# Patient Record
Sex: Male | Born: 1965 | Race: Black or African American | Hispanic: No | Marital: Single | Smoking: Current some day smoker
Health system: Southern US, Community
[De-identification: ages and names within clinical notes are randomized; demographics above are authoritative.]

## PROBLEM LIST (undated history)

## (undated) DIAGNOSIS — E119 Type 2 diabetes mellitus without complications: Secondary | ICD-10-CM

## (undated) DIAGNOSIS — S069XAA Unspecified intracranial injury with loss of consciousness status unknown, initial encounter: Secondary | ICD-10-CM

## (undated) HISTORY — PX: APPENDECTOMY: SHX54

---

## 1998-01-08 ENCOUNTER — Emergency Department (HOSPITAL_COMMUNITY): Admission: EM | Admit: 1998-01-08 | Discharge: 1998-01-08 | Payer: Self-pay | Admitting: Emergency Medicine

## 2000-08-02 ENCOUNTER — Emergency Department (HOSPITAL_COMMUNITY): Admission: EM | Admit: 2000-08-02 | Discharge: 2000-08-02 | Payer: Self-pay | Admitting: Emergency Medicine

## 2000-08-02 ENCOUNTER — Encounter: Payer: Self-pay | Admitting: Emergency Medicine

## 2000-08-03 ENCOUNTER — Emergency Department (HOSPITAL_COMMUNITY): Admission: EM | Admit: 2000-08-03 | Discharge: 2000-08-04 | Payer: Self-pay | Admitting: Emergency Medicine

## 2000-09-06 ENCOUNTER — Encounter: Admission: RE | Admit: 2000-09-06 | Discharge: 2000-12-05 | Payer: Self-pay | Admitting: Internal Medicine

## 2008-08-02 ENCOUNTER — Emergency Department (HOSPITAL_COMMUNITY): Admission: EM | Admit: 2008-08-02 | Discharge: 2008-08-02 | Payer: Self-pay | Admitting: Emergency Medicine

## 2009-01-05 ENCOUNTER — Inpatient Hospital Stay (HOSPITAL_COMMUNITY): Admission: EM | Admit: 2009-01-05 | Discharge: 2009-01-06 | Payer: Self-pay | Admitting: Emergency Medicine

## 2009-10-01 ENCOUNTER — Emergency Department (HOSPITAL_COMMUNITY): Admission: EM | Admit: 2009-10-01 | Discharge: 2009-10-01 | Payer: Self-pay | Admitting: Emergency Medicine

## 2010-04-28 LAB — DIFFERENTIAL
Eosinophils Relative: 0 % (ref 0–5)
Lymphocytes Relative: 10 % — ABNORMAL LOW (ref 12–46)
Lymphs Abs: 1.2 10*3/uL (ref 0.7–4.0)
Monocytes Absolute: 0.5 10*3/uL (ref 0.1–1.0)

## 2010-04-28 LAB — GLUCOSE, CAPILLARY
Glucose-Capillary: 180 mg/dL — ABNORMAL HIGH (ref 70–99)
Glucose-Capillary: 206 mg/dL — ABNORMAL HIGH (ref 70–99)
Glucose-Capillary: 43 mg/dL — CL (ref 70–99)

## 2010-04-28 LAB — URINALYSIS, ROUTINE W REFLEX MICROSCOPIC
Glucose, UA: 500 mg/dL — AB
pH: 6 (ref 5.0–8.0)

## 2010-04-28 LAB — CBC
HCT: 37.8 % — ABNORMAL LOW (ref 39.0–52.0)
MCV: 87.1 fL (ref 78.0–100.0)
Platelets: 297 10*3/uL (ref 150–400)
RBC: 4.34 MIL/uL (ref 4.22–5.81)
WBC: 13 10*3/uL — ABNORMAL HIGH (ref 4.0–10.5)

## 2010-04-28 LAB — BASIC METABOLIC PANEL
Chloride: 105 mEq/L (ref 96–112)
GFR calc Af Amer: >60 mL/min (ref >60)
Potassium: 4.2 mEq/L (ref 3.5–5.1)

## 2010-05-18 LAB — DIFFERENTIAL
Basophils Absolute: 0 10*3/uL (ref 0.0–0.1)
Basophils Relative: 1 % (ref 0–1)
Eosinophils Absolute: 0 10*3/uL (ref 0.0–0.7)
Eosinophils Relative: 0 % (ref 0–5)
Eosinophils Relative: 0 % (ref 0–5)
Lymphocytes Relative: 13 % (ref 12–46)
Lymphs Abs: 0.6 10*3/uL — ABNORMAL LOW (ref 0.7–4.0)
Monocytes Absolute: 0.5 10*3/uL (ref 0.1–1.0)
Monocytes Absolute: 0.9 10*3/uL (ref 0.1–1.0)
Monocytes Relative: 7 % (ref 3–12)
Neutro Abs: 10.5 10*3/uL — ABNORMAL HIGH (ref 1.7–7.7)

## 2010-05-18 LAB — RAPID URINE DRUG SCREEN, HOSP PERFORMED
Opiates: NOT DETECTED
Tetrahydrocannabinol: NOT DETECTED

## 2010-05-18 LAB — CULTURE, BLOOD (ROUTINE X 2)
Culture: NO GROWTH
Report Status: 11282010

## 2010-05-18 LAB — CBC
HCT: 40.8 % (ref 39.0–52.0)
HCT: 47.8 % (ref 39.0–52.0)
Hemoglobin: 15.8 g/dL (ref 13.0–17.0)
MCV: 85.8 fL (ref 78.0–100.0)
Platelets: 322 10*3/uL (ref 150–400)
RBC: 5.57 MIL/uL (ref 4.22–5.81)
RDW: 13.7 % (ref 11.5–15.5)
WBC: 13.2 10*3/uL — ABNORMAL HIGH (ref 4.0–10.5)
WBC: 23.8 10*3/uL — ABNORMAL HIGH (ref 4.0–10.5)

## 2010-05-18 LAB — CARDIAC PANEL(CRET KIN+CKTOT+MB+TROPI)
CK, MB: 17.6 ng/mL — ABNORMAL HIGH (ref 0.3–4.0)
Relative Index: 1.7 (ref 0.0–2.5)
Total CK: 1030 U/L — ABNORMAL HIGH (ref 7–232)
Troponin I: 0.04 ng/mL (ref 0.00–0.06)

## 2010-05-18 LAB — ETHANOL: Alcohol, Ethyl (B): <5 mg/dL (ref 0–10)

## 2010-05-18 LAB — COMPREHENSIVE METABOLIC PANEL
ALT: 25 U/L (ref 0–53)
AST: 48 U/L — ABNORMAL HIGH (ref 0–37)
AST: 58 U/L — ABNORMAL HIGH (ref 0–37)
Albumin: 3.2 g/dL — ABNORMAL LOW (ref 3.5–5.2)
Albumin: 3.7 g/dL (ref 3.5–5.2)
Alkaline Phosphatase: 72 U/L (ref 39–117)
BUN: 21 mg/dL (ref 6–23)
CO2: 26 mEq/L (ref 19–32)
Chloride: 103 mEq/L (ref 96–112)
GFR calc Af Amer: >60 mL/min (ref >60)
GFR calc Af Amer: >60 mL/min (ref >60)
GFR calc non Af Amer: >60 mL/min (ref >60)
Potassium: 4.1 mEq/L (ref 3.5–5.1)
Potassium: 4.2 mEq/L (ref 3.5–5.1)
Sodium: 139 mEq/L (ref 135–145)
Total Bilirubin: 0.4 mg/dL (ref 0.3–1.2)
Total Protein: 6.8 g/dL (ref 6.0–8.3)

## 2010-05-18 LAB — GLUCOSE, CAPILLARY
Glucose-Capillary: 129 mg/dL — ABNORMAL HIGH (ref 70–99)
Glucose-Capillary: 195 mg/dL — ABNORMAL HIGH (ref 70–99)
Glucose-Capillary: 256 mg/dL — ABNORMAL HIGH (ref 70–99)
Glucose-Capillary: 269 mg/dL — ABNORMAL HIGH (ref 70–99)
Glucose-Capillary: 83 mg/dL (ref 70–99)
Glucose-Capillary: 85 mg/dL (ref 70–99)
Glucose-Capillary: 90 mg/dL (ref 70–99)
Glucose-Capillary: 90 mg/dL (ref 70–99)

## 2010-05-18 LAB — POCT CARDIAC MARKERS: Myoglobin, poc: >500 ng/mL (ref 12–200)

## 2010-05-18 LAB — URINE CULTURE
Colony Count: NO GROWTH
Culture: NO GROWTH

## 2010-05-18 LAB — URINALYSIS, ROUTINE W REFLEX MICROSCOPIC
Leukocytes, UA: NEGATIVE
Specific Gravity, Urine: >1.03 — ABNORMAL HIGH (ref 1.005–1.030)
Urobilinogen, UA: 0.2 mg/dL (ref 0.0–1.0)

## 2010-05-18 LAB — PHOSPHORUS: Phosphorus: 3.1 mg/dL (ref 2.3–4.6)

## 2010-05-18 LAB — URINE MICROSCOPIC-ADD ON

## 2010-05-18 LAB — CK TOTAL AND CKMB (NOT AT ARMC): Relative Index: 1.6 (ref 0.0–2.5)

## 2010-05-18 LAB — TSH: TSH: 0.587 u[IU]/mL (ref 0.350–4.500)

## 2010-05-18 LAB — TROPONIN I: Troponin I: 0.05 ng/mL (ref 0.00–0.06)

## 2010-05-18 LAB — MAGNESIUM: Magnesium: 2.1 mg/dL (ref 1.5–2.5)

## 2010-05-18 LAB — LACTIC ACID, PLASMA: Lactic Acid, Venous: 1.3 mmol/L (ref 0.5–2.2)

## 2010-05-23 LAB — BASIC METABOLIC PANEL
GFR calc non Af Amer: >60 mL/min (ref >60)
Potassium: 4.2 mEq/L (ref 3.5–5.1)
Sodium: 136 mEq/L (ref 135–145)

## 2010-05-23 LAB — GLUCOSE, CAPILLARY
Glucose-Capillary: 226 mg/dL — ABNORMAL HIGH (ref 70–99)
Glucose-Capillary: 299 mg/dL — ABNORMAL HIGH (ref 70–99)
Glucose-Capillary: 58 mg/dL — ABNORMAL LOW (ref 70–99)

## 2010-06-30 ENCOUNTER — Emergency Department (HOSPITAL_COMMUNITY)
Admission: EM | Admit: 2010-06-30 | Discharge: 2010-06-30 | Disposition: A | Discharge status: Home / Self Care | Payer: Medicaid Other | Attending: Emergency Medicine | Admitting: Emergency Medicine

## 2010-06-30 DIAGNOSIS — E1169 Type 2 diabetes mellitus with other specified complication: Secondary | ICD-10-CM | POA: Insufficient documentation

## 2010-06-30 LAB — GLUCOSE, CAPILLARY: Glucose-Capillary: 214 mg/dL — ABNORMAL HIGH (ref 70–99)

## 2010-06-30 LAB — BASIC METABOLIC PANEL
BUN: 11 mg/dL (ref 6–23)
Calcium: 9.9 mg/dL (ref 8.4–10.5)
Creatinine, Ser: 0.8 mg/dL (ref 0.4–1.5)
GFR calc non Af Amer: >60 mL/min (ref >60)

## 2010-07-01 LAB — GLUCOSE, CAPILLARY: Glucose-Capillary: 167 mg/dL — ABNORMAL HIGH (ref 70–99)

## 2016-08-04 ENCOUNTER — Encounter: Payer: Self-pay | Admitting: Emergency Medicine

## 2016-08-04 ENCOUNTER — Emergency Department
Admission: EM | Admit: 2016-08-04 | Discharge: 2016-08-04 | Disposition: A | Discharge status: Home / Self Care | Payer: Medicare Other | Attending: Emergency Medicine | Admitting: Emergency Medicine

## 2016-08-04 DIAGNOSIS — Z794 Long term (current) use of insulin: Secondary | ICD-10-CM | POA: Insufficient documentation

## 2016-08-04 DIAGNOSIS — Z8639 Personal history of other endocrine, nutritional and metabolic disease: Secondary | ICD-10-CM | POA: Insufficient documentation

## 2016-08-04 DIAGNOSIS — E119 Type 2 diabetes mellitus without complications: Secondary | ICD-10-CM | POA: Diagnosis not present

## 2016-08-04 DIAGNOSIS — E162 Hypoglycemia, unspecified: Secondary | ICD-10-CM | POA: Diagnosis not present

## 2016-08-04 DIAGNOSIS — F17210 Nicotine dependence, cigarettes, uncomplicated: Secondary | ICD-10-CM | POA: Insufficient documentation

## 2016-08-04 HISTORY — DX: Type 2 diabetes mellitus without complications: E11.9

## 2016-08-04 LAB — URINALYSIS, COMPLETE (UACMP) WITH MICROSCOPIC
BACTERIA UA: NONE SEEN
BILIRUBIN URINE: NEGATIVE
Glucose, UA: NEGATIVE mg/dL
KETONES UR: NEGATIVE mg/dL
Leukocytes, UA: NEGATIVE
NITRITE: NEGATIVE
PH: 5 (ref 5.0–8.0)
Protein, ur: 100 mg/dL — AB
SPECIFIC GRAVITY, URINE: 1.021 (ref 1.005–1.030)
Squamous Epithelial / LPF: NONE SEEN

## 2016-08-04 LAB — COMPREHENSIVE METABOLIC PANEL
ALBUMIN: 4 g/dL (ref 3.5–5.0)
ALK PHOS: 78 U/L (ref 38–126)
ALT: 21 U/L (ref 17–63)
ANION GAP: 7 (ref 5–15)
AST: 55 U/L — ABNORMAL HIGH (ref 15–41)
BUN: 15 mg/dL (ref 6–20)
CALCIUM: 9 mg/dL (ref 8.9–10.3)
CO2: 32 mmol/L (ref 22–32)
Chloride: 101 mmol/L (ref 101–111)
Creatinine, Ser: 1.46 mg/dL — ABNORMAL HIGH (ref 0.61–1.24)
GFR calc non Af Amer: 54 mL/min — ABNORMAL LOW (ref >60)
Glucose, Bld: 62 mg/dL — ABNORMAL LOW (ref 65–99)
POTASSIUM: 3.5 mmol/L (ref 3.5–5.1)
Sodium: 140 mmol/L (ref 135–145)
Total Bilirubin: 0.4 mg/dL (ref 0.3–1.2)
Total Protein: 7.6 g/dL (ref 6.5–8.1)

## 2016-08-04 LAB — CBC WITH DIFFERENTIAL/PLATELET
BASOS PCT: 1 %
Basophils Absolute: 0 10*3/uL (ref 0–0.1)
EOS ABS: 0 10*3/uL (ref 0–0.7)
EOS PCT: 0 %
HCT: 39.4 % — ABNORMAL LOW (ref 40.0–52.0)
Hemoglobin: 13.1 g/dL (ref 13.0–18.0)
LYMPHS ABS: 1.3 10*3/uL (ref 1.0–3.6)
Lymphocytes Relative: 20 %
MCH: 28.6 pg (ref 26.0–34.0)
MCHC: 33.3 g/dL (ref 32.0–36.0)
MCV: 85.9 fL (ref 80.0–100.0)
MONO ABS: 0.4 10*3/uL (ref 0.2–1.0)
MONOS PCT: 6 %
Neutro Abs: 4.6 10*3/uL (ref 1.4–6.5)
Neutrophils Relative %: 73 %
Platelets: 323 10*3/uL (ref 150–440)
RBC: 4.59 MIL/uL (ref 4.40–5.90)
RDW: 13.5 % (ref 11.5–14.5)
WBC: 6.3 10*3/uL (ref 3.8–10.6)

## 2016-08-04 LAB — GLUCOSE, CAPILLARY
GLUCOSE-CAPILLARY: 142 mg/dL — AB (ref 65–99)
Glucose-Capillary: 170 mg/dL — ABNORMAL HIGH (ref 65–99)
Glucose-Capillary: 113 mg/dL — ABNORMAL HIGH (ref 65–99)

## 2016-08-04 NOTE — Discharge Instructions (Signed)
Please decrease your insulin dose by 25% (to 30 units, instead of your normal 40 units) and call your regular doctor today to discuss further follow-up and possible medication adjustments to your diabetes medications.

## 2016-08-04 NOTE — ED Notes (Signed)
Pt given a turkey sandwich tray 

## 2016-08-04 NOTE — ED Provider Notes (Addendum)
[facility] Emergency Department Provider Note   ____________________________________________   None    (approximate)  I have reviewed the triage vital signs and the nursing notes.   HISTORY  Chief Complaint Hypoglycemia    HPI Keith Ewing is a 51 y.o. male who comes into the hospital today with low blood sugar.The patient reports that he is unsure why he is here. He thinks that his blood sugar was low but someone checked. He reports that he's been taking his medications and eating. The patient denies any vomiting diarrhea or pain. According to EMS the patient was found unresponsive. He is here from her group home. His blood sugar was 28. EMS gave the patient half an amp of dextrose which increases sugar to 125. He had a second glucose reading on his way here was 87 and then 73. The patient is here for evaluation.   Past Medical History:  Diagnosis Date  . Diabetes mellitus without complication (HCC)     There are no active problems to display for this patient.   Past Surgical History:  Procedure Laterality Date  . APPENDECTOMY      Prior to Admission medications   Not on File    Allergies Patient has no allergy information on record.  No family history on file.  Social History Social History  Substance Use Topics  . Smoking status: Current Some Day Smoker  . Smokeless tobacco: Not on file  . Alcohol use No    Review of Systems  Constitutional: No fever/chills Eyes: No visual changes. ENT: No sore throat. Cardiovascular: Denies chest pain. Respiratory: Denies shortness of breath. Gastrointestinal: No abdominal pain.  No nausea, no vomiting.  No diarrhea.  No constipation. Genitourinary: Negative for dysuria. Musculoskeletal: Negative for back pain. Skin: Negative for rash. Neurological: Unresponsive   ____________________________________________   PHYSICAL EXAM:  VITAL SIGNS: ED Triage Vitals  Enc Vitals Group   BP 08/04/16 0513 (!) 164/90     Pulse Rate 08/04/16 0513 84     Resp 08/04/16 0513 19     Temp 08/04/16 0513 98.1 F (36.7 C)     Temp Source 08/04/16 0513 Oral     SpO2 08/04/16 0513 95 %     Weight 08/04/16 0514 180 lb (81.6 kg)     Height 08/04/16 0514 5\' 7"  (1.702 m)     Head Circumference --      Peak Flow --      Pain Score --      Pain Loc --      Pain Edu? --      Excl. in GC? --     Constitutional: Alert and oriented. Well appearing and in no acute distress. Eyes: Conjunctivae are normal. PERRL. EOMI. Head: Atraumatic. Nose: No congestion/rhinnorhea. Mouth/Throat: Mucous membranes are moist.  Oropharynx non-erythematous. Cardiovascular: Normal rate, regular rhythm. Grossly normal heart sounds.  Good peripheral circulation. Respiratory: Normal respiratory effort.  No retractions. Lungs CTAB. Gastrointestinal: Soft and nontender. No distention. Positive bowel sounds Musculoskeletal: No lower extremity tenderness nor edema.   Neurologic:  Normal speech and language.  Skin:  Skin is warm, dry and intact.  Psychiatric: Mood and affect are normal.  ____________________________________________   LABS (all labs ordered are listed, but only abnormal results are displayed)  Labs Reviewed  CBC WITH DIFFERENTIAL/PLATELET - Abnormal; Notable for the following:       Result Value   HCT 39.4 (*)    All other components within normal limits  COMPREHENSIVE METABOLIC PANEL - Abnormal; Notable for the following:    Glucose, Bld 62 (*)    Creatinine, Ser 1.46 (*)    AST 55 (*)    GFR calc non Af Amer 54 (*)    All other components within normal limits  URINALYSIS, COMPLETE (UACMP) WITH MICROSCOPIC - Abnormal; Notable for the following:    Color, Urine YELLOW (*)    APPearance CLEAR (*)    Hgb urine dipstick MODERATE (*)    Protein, ur 100 (*)    All other components within normal limits  GLUCOSE, CAPILLARY - Abnormal; Notable for the following:    Glucose-Capillary 142 (*)      All other components within normal limits  CBG MONITORING, ED   ____________________________________________  EKG  none ____________________________________________  RADIOLOGY  No results found.  ____________________________________________   PROCEDURES  Procedure(s) performed: None  Procedures  Critical Care performed: No  ____________________________________________   INITIAL IMPRESSION / ASSESSMENT AND PLAN / ED COURSE  Pertinent labs & imaging results that were available during my care of the patient were reviewed by me and considered in my medical decision making (see chart for details).  This is a 51 year old male who comes into the hospital today because of low blood sugar. The patient reports that he's been taking his medications appropriately and eating. The patient's initial blood work is unremarkable. We did give the patient some graham crackers, applesauce and a sandwich tray. We will continue to monitor the patient's blood sugars.     The patient's blood work is unremarkable he ate and is having no symptoms. We will get the patient's medication list from his caregiver and we will reassess the patient's blood sugar. It was 142 at 6 AM. The patient's care will be signed out to Dr. Fanny BienQuale ____________________________________________   FINAL CLINICAL IMPRESSION(S) / ED DIAGNOSES  Final diagnoses:  Hypoglycemia      NEW MEDICATIONS STARTED DURING THIS VISIT:  New Prescriptions   No medications on file     Note:  This document was prepared using Dragon voice recognition software and may include unintentional dictation errors.    Rebecka ApleyWebster, Quentez Lober P, MD 08/04/16 0701    Rebecka ApleyWebster, Bianca Raneri P, MD 08/04/16 [phone]

## 2016-08-04 NOTE — ED Provider Notes (Signed)
Simvastatin HCTZ Perphenazine  Metformin Insulin (Lantus 40 units twice daily, but often only takes it occasionally)  Spoke with his family, Barb MerinoKim Bubar and reports this does happen occasionally, usually would give him juice or food with improvement when this does occur at home. Mother reports this occurs frequently.   ----------------------------------------- 7:58 AM on 08/04/2016 -----------------------------------------  Patient continues rest comfort. Blood sugar has normalized. Plan to recheck in about another 30 minutes, if stable I feel the patient give a discharge. Caretaker will assure that he continues to eat regular meals, check his blood sugars on a regular basis and also at 2 AM. If his blood sugars fall below 70 and a time or develops symptoms they will provide a juice or protein meal if he is awake, if he is having any confusion or decreased responsiveness they will reactivate 911 system.  Does not appear to be in any oral long-acting hypoglycemics. He does take Lantus but has been compliant with doses. I have recommended he decrease his Lantus dosing by one fourth.  ----------------------------------------- 9:18 AM on 08/04/2016 -----------------------------------------  Patient blood sugar stable, no further hypoglycemia. He is been able to eat a meal without difficulty. He is awake alert understanding of discharge plan, also reviewed with his care worker who is with him. They will plan and set an alarm to check his blood sugar at 2 AM in addition to his regular glucose checks throughout the day.       Sharyn CreamerQuale, Diem Pagnotta, MD 08/04/16 [phone]

## 2016-08-04 NOTE — ED Notes (Signed)
Pt verbalized understanding of discharge instructions. NAD at this time. 

## 2016-08-04 NOTE — ED Triage Notes (Signed)
Pt arrived via ems from group home. Pt was found unresponsive and when fire dept arrived pt's blood sugar was 28. EMS arrived and administered 1/2 amp of dextrose which increased pt's blood sugar to 125. In route a second blood glucose reading was 87 and upon arrival pt's blood glucose reading was 73. Pt was given 10 oz of orange juice and a pack of 3 graham crackers. Pt alert and oriented to time, place, person, and situation. Pt is a poor historian, some metal delays noted. Pt was able to stand with a steady gait at bedside in order to use a urinal. Pt denies any pain but states a overall weakness. Pt's caregiver not at bedside; however, reported to ems they were on the way.

## 2016-08-04 NOTE — ED Notes (Signed)
Pt's CBG 113. Pt given orange juice per MD request.

## 2017-10-19 ENCOUNTER — Other Ambulatory Visit: Payer: Self-pay

## 2017-10-19 ENCOUNTER — Observation Stay (HOSPITAL_COMMUNITY): Payer: Medicare Other

## 2017-10-19 ENCOUNTER — Observation Stay (HOSPITAL_COMMUNITY)
Admission: EM | Admit: 2017-10-19 | Discharge: 2017-10-20 | Disposition: A | Discharge status: Home / Self Care | Payer: Medicare Other | Attending: Internal Medicine | Admitting: Internal Medicine

## 2017-10-19 ENCOUNTER — Encounter (HOSPITAL_COMMUNITY): Payer: Self-pay | Admitting: Radiology

## 2017-10-19 ENCOUNTER — Emergency Department (HOSPITAL_COMMUNITY): Payer: Medicare Other

## 2017-10-19 DIAGNOSIS — Z79899 Other long term (current) drug therapy: Secondary | ICD-10-CM | POA: Diagnosis not present

## 2017-10-19 DIAGNOSIS — J9811 Atelectasis: Secondary | ICD-10-CM | POA: Insufficient documentation

## 2017-10-19 DIAGNOSIS — E1110 Type 2 diabetes mellitus with ketoacidosis without coma: Principal | ICD-10-CM | POA: Diagnosis present

## 2017-10-19 DIAGNOSIS — E1310 Other specified diabetes mellitus with ketoacidosis without coma: Secondary | ICD-10-CM

## 2017-10-19 DIAGNOSIS — Z794 Long term (current) use of insulin: Secondary | ICD-10-CM | POA: Insufficient documentation

## 2017-10-19 DIAGNOSIS — N179 Acute kidney failure, unspecified: Secondary | ICD-10-CM | POA: Diagnosis not present

## 2017-10-19 DIAGNOSIS — Z23 Encounter for immunization: Secondary | ICD-10-CM | POA: Diagnosis not present

## 2017-10-19 DIAGNOSIS — F17200 Nicotine dependence, unspecified, uncomplicated: Secondary | ICD-10-CM | POA: Insufficient documentation

## 2017-10-19 DIAGNOSIS — E871 Hypo-osmolality and hyponatremia: Secondary | ICD-10-CM | POA: Diagnosis present

## 2017-10-19 DIAGNOSIS — R110 Nausea: Secondary | ICD-10-CM

## 2017-10-19 DIAGNOSIS — E119 Type 2 diabetes mellitus without complications: Secondary | ICD-10-CM

## 2017-10-19 DIAGNOSIS — I700 Atherosclerosis of aorta: Secondary | ICD-10-CM | POA: Diagnosis not present

## 2017-10-19 DIAGNOSIS — R112 Nausea with vomiting, unspecified: Secondary | ICD-10-CM | POA: Diagnosis not present

## 2017-10-19 LAB — URINALYSIS, ROUTINE W REFLEX MICROSCOPIC
BACTERIA UA: NONE SEEN
BILIRUBIN URINE: NEGATIVE
Glucose, UA: >=500 mg/dL — AB
KETONES UR: 20 mg/dL — AB
LEUKOCYTES UA: NEGATIVE
Nitrite: NEGATIVE
PROTEIN: NEGATIVE mg/dL
Specific Gravity, Urine: 1.022 (ref 1.005–1.030)
pH: 5 (ref 5.0–8.0)

## 2017-10-19 LAB — COMPREHENSIVE METABOLIC PANEL
ALT: 19 U/L (ref 0–44)
AST: 36 U/L (ref 15–41)
Albumin: 4.1 g/dL (ref 3.5–5.0)
Alkaline Phosphatase: 84 U/L (ref 38–126)
Anion gap: 19 — ABNORMAL HIGH (ref 5–15)
BUN: 42 mg/dL — ABNORMAL HIGH (ref 6–20)
CO2: 22 mmol/L (ref 22–32)
Calcium: 9.1 mg/dL (ref 8.9–10.3)
Chloride: 88 mmol/L — ABNORMAL LOW (ref 98–111)
Creatinine, Ser: 2.43 mg/dL — ABNORMAL HIGH (ref 0.61–1.24)
GFR calc Af Amer: 33 mL/min — ABNORMAL LOW (ref >60)
GFR calc non Af Amer: 29 mL/min — ABNORMAL LOW (ref >60)
GLUCOSE: 697 mg/dL — AB (ref 70–99)
POTASSIUM: 4.4 mmol/L (ref 3.5–5.1)
SODIUM: 129 mmol/L — AB (ref 135–145)
Total Bilirubin: 1.2 mg/dL (ref 0.3–1.2)
Total Protein: 7.7 g/dL (ref 6.5–8.1)

## 2017-10-19 LAB — CBG MONITORING, ED
GLUCOSE-CAPILLARY: 553 mg/dL — AB (ref 70–99)
Glucose-Capillary: >600 mg/dL (ref 70–99)

## 2017-10-19 LAB — CBC WITH DIFFERENTIAL/PLATELET
Basophils Absolute: 0 10*3/uL (ref 0.0–0.1)
Basophils Relative: 0 %
EOS ABS: 0 10*3/uL (ref 0.0–0.7)
Eosinophils Relative: 0 %
HEMATOCRIT: 37.6 % — AB (ref 39.0–52.0)
HEMOGLOBIN: 12.6 g/dL — AB (ref 13.0–17.0)
LYMPHS ABS: 2.1 10*3/uL (ref 0.7–4.0)
LYMPHS PCT: 22 %
MCH: 28.6 pg (ref 26.0–34.0)
MCHC: 33.5 g/dL (ref 30.0–36.0)
MCV: 85.5 fL (ref 78.0–100.0)
MONOS PCT: 8 %
Monocytes Absolute: 0.7 10*3/uL (ref 0.1–1.0)
NEUTROS ABS: 6.9 10*3/uL (ref 1.7–7.7)
NEUTROS PCT: 70 %
Platelets: 337 10*3/uL (ref 150–400)
RBC: 4.4 MIL/uL (ref 4.22–5.81)
RDW: 12.3 % (ref 11.5–15.5)
WBC: 9.7 10*3/uL (ref 4.0–10.5)

## 2017-10-19 LAB — GLUCOSE, CAPILLARY
GLUCOSE-CAPILLARY: 420 mg/dL — AB (ref 70–99)
GLUCOSE-CAPILLARY: 344 mg/dL — AB (ref 70–99)
GLUCOSE-CAPILLARY: 213 mg/dL — AB (ref 70–99)
GLUCOSE-CAPILLARY: 125 mg/dL — AB (ref 70–99)
Glucose-Capillary: 397 mg/dL — ABNORMAL HIGH (ref 70–99)
Glucose-Capillary: 448 mg/dL — ABNORMAL HIGH (ref 70–99)
Glucose-Capillary: 112 mg/dL — ABNORMAL HIGH (ref 70–99)
Glucose-Capillary: 129 mg/dL — ABNORMAL HIGH (ref 70–99)

## 2017-10-19 LAB — BASIC METABOLIC PANEL
Anion gap: 7 (ref 5–15)
BUN: 32 mg/dL — ABNORMAL HIGH (ref 6–20)
CO2: 31 mmol/L (ref 22–32)
Calcium: 8.5 mg/dL — ABNORMAL LOW (ref 8.9–10.3)
Chloride: 102 mmol/L (ref 98–111)
Creatinine, Ser: 1.83 mg/dL — ABNORMAL HIGH (ref 0.61–1.24)
GFR calc Af Amer: 47 mL/min — ABNORMAL LOW (ref >60)
GFR calc non Af Amer: 40 mL/min — ABNORMAL LOW (ref >60)
Glucose, Bld: 190 mg/dL — ABNORMAL HIGH (ref 70–99)
Potassium: 3.8 mmol/L (ref 3.5–5.1)
Sodium: 140 mmol/L (ref 135–145)

## 2017-10-19 LAB — SODIUM, URINE, RANDOM: Sodium, Ur: 13 mmol/L

## 2017-10-19 LAB — MRSA PCR SCREENING: MRSA by PCR: NEGATIVE

## 2017-10-19 LAB — CREATININE, URINE, RANDOM: Creatinine, Urine: 117.47 mg/dL

## 2017-10-19 LAB — HEMOGLOBIN A1C
HEMOGLOBIN A1C: 9.8 % — AB (ref 4.8–5.6)
MEAN PLASMA GLUCOSE: 234.56 mg/dL

## 2017-10-19 LAB — LIPASE, BLOOD: Lipase: 59 U/L — ABNORMAL HIGH (ref 11–51)

## 2017-10-19 LAB — TROPONIN I: Troponin I: <0.03 ng/mL (ref <0.03)

## 2017-10-19 MED ORDER — SODIUM CHLORIDE 0.9 % IV SOLN: INTRAVENOUS | Status: DC

## 2017-10-19 MED ORDER — INSULIN ASPART 100 UNIT/ML ~~LOC~~ SOLN
0.0000 [IU] | SUBCUTANEOUS | Status: DC
  Administered 2017-10-20: 3 [IU] via SUBCUTANEOUS

## 2017-10-19 MED ORDER — SODIUM CHLORIDE 0.9 % IV BOLUS
1000.0000 mL | Freq: Once | INTRAVENOUS | Status: AC
  Administered 2017-10-19: 1000 mL via INTRAVENOUS

## 2017-10-19 MED ORDER — PNEUMOCOCCAL VAC POLYVALENT 25 MCG/0.5ML IJ INJ
0.5000 mL | INJECTION | INTRAMUSCULAR | Status: AC
  Administered 2017-10-20: 0.5 mL via INTRAMUSCULAR
  Filled 2017-10-19: qty 0.5

## 2017-10-19 MED ORDER — SODIUM CHLORIDE 0.9 % IV SOLN
INTRAVENOUS | Status: DC
  Administered 2017-10-19: 23:00:00 via INTRAVENOUS

## 2017-10-19 MED ORDER — ENOXAPARIN SODIUM 40 MG/0.4ML ~~LOC~~ SOLN
40.0000 mg | SUBCUTANEOUS | Status: DC
  Administered 2017-10-19: 40 mg via SUBCUTANEOUS
  Filled 2017-10-19: qty 0.4

## 2017-10-19 MED ORDER — DEXTROSE 50 % IV SOLN: 25.0000 mL | INTRAVENOUS | Status: DC | PRN

## 2017-10-19 MED ORDER — INSULIN REGULAR BOLUS VIA INFUSION
0.0000 [IU] | Freq: Three times a day (TID) | INTRAVENOUS | Status: DC
  Administered 2017-10-19: 4.2 [IU] via INTRAVENOUS
  Filled 2017-10-19: qty 10

## 2017-10-19 MED ORDER — DEXTROSE-NACL 5-0.45 % IV SOLN
INTRAVENOUS | Status: DC
  Administered 2017-10-19: 20:00:00 via INTRAVENOUS

## 2017-10-19 MED ORDER — SODIUM CHLORIDE 0.9 % IV SOLN
INTRAVENOUS | Status: DC
  Administered 2017-10-19: 4.9 [IU]/h via INTRAVENOUS
  Filled 2017-10-19 (×3): qty 1

## 2017-10-19 MED ORDER — INSULIN GLARGINE 100 UNIT/ML ~~LOC~~ SOLN
40.0000 [IU] | Freq: Every day | SUBCUTANEOUS | Status: DC
  Administered 2017-10-19: 40 [IU] via SUBCUTANEOUS
  Filled 2017-10-19 (×3): qty 0.4

## 2017-10-19 NOTE — ED Provider Notes (Signed)
[facility] EMERGENCY DEPARTMENT Provider Note   CSN: [id number] Arrival date & time: 10/19/17  1123     History   Chief Complaint Chief Complaint  Patient presents with  . Hyperglycemia    HPI Keith Ewing is a 52 y.o. male.  HPI  Pt was seen at 1200. Per pt, c/o gradual onset and persistence of constant "high blood sugars" this past week. Has been associated with nausea. Pt states he had routine labs drawn today by his PMD at the [place], and was called PTA "because my glucose was in the 700's." Pt was then told to go to the ED for further evaluation. Pt currently denies any other symptoms. Denies vomiting/diarrhea, no abd pain, no CP/SOB, no cough, no fevers, no rash, no focal motor weakness.    Past Medical History:  Diagnosis Date  . Diabetes mellitus without complication (HCC)     There are no active problems to display for this patient.     Home Medications    Prior to Admission medications   Not on File    Family History No family history on file.  Social History Social History   Tobacco Use  . Smoking status: Current Some Day Smoker  Substance Use Topics  . Alcohol use: No  . Drug use: Not on file     Allergies   Patient has no known allergies.   Review of Systems Review of Systems ROS: Statement: All systems negative except as marked or noted in the HPI; Constitutional: Negative for fever and chills. ; ; Eyes: Negative for eye pain, redness and discharge. ; ; ENMT: Negative for ear pain, hoarseness, nasal congestion, sinus pressure and sore throat. ; ; Cardiovascular: Negative for chest pain, palpitations, diaphoresis, dyspnea and peripheral edema. ; ; Respiratory: Negative for cough, wheezing and stridor. ; ; Gastrointestinal: +nausea. Negative for vomiting, diarrhea, abdominal pain, blood in stool, hematemesis, jaundice and rectal bleeding. . ; ; Genitourinary: Negative for dysuria, flank pain and hematuria. ; ; Musculoskeletal: Negative for back pain  and neck pain. Negative for swelling and trauma.; ; Skin: Negative for pruritus, rash, abrasions, blisters, bruising and skin lesion.; ; Neuro: Negative for headache, lightheadedness and neck stiffness. Negative for weakness, altered level of consciousness, altered mental status, extremity weakness, paresthesias, involuntary movement, seizure and syncope.       Physical Exam Updated Vital Signs BP 126/73   Pulse 79   Temp 98.5 F (36.9 C) (Oral)   Resp 16   Ht 5\' 7"  (1.702 m)   Wt 78 kg   SpO2 94%   BMI 26.94 kg/m   Physical Exam 1205: Physical examination:  Nursing notes reviewed; Vital signs and O2 SAT reviewed;  Constitutional: Well developed, Well nourished, Well hydrated, In no acute distress; Head:  Normocephalic, atraumatic; Eyes: EOMI, PERRL, No scleral icterus; ENMT: Mouth and pharynx normal, Mucous membranes moist; Neck: Supple, Full range of motion, No lymphadenopathy; Cardiovascular: Regular rate and rhythm, No gallop; Respiratory: Breath sounds clear & equal bilaterally, No wheezes.  Speaking full sentences with ease, Normal respiratory effort/excursion; Chest: Nontender, Movement normal; Abdomen: Soft, Nontender, Nondistended, Normal bowel sounds; Genitourinary: No CVA tenderness; Extremities: Peripheral pulses normal, No tenderness, No edema, No calf edema or asymmetry.; Neuro: AA&Ox3, Major CN grossly intact.  Speech clear. No gross focal motor or sensory deficits in extremities.; Skin: Color normal, Warm, Dry.   ED Treatments / Results  Labs (all labs ordered are listed, but only abnormal results are displayed)   EKG EKG  Interpretation  Date/Time:  Friday October 19 2017 12:12:04 EDT Ventricular Rate:  82 PR Interval:    QRS Duration: 95 QT Interval:  390 QTC Calculation: 456 R Axis:   30 Text Interpretation:  Sinus rhythm Abnormal R-wave progression, early transition Minimal ST elevation, anterior leads probably due to LVH When compared with ECG of 08/04/2016  No significant change was found Confirmed by Samuel Jester [phone]) on 10/19/2017 12:15:30 PM   Radiology   Procedures Procedures (including critical care time)  Medications Ordered in ED Medications  sodium chloride 0.9 % bolus 1,000 mL (1,000 mLs Intravenous New Bag/Given 10/19/17 1313)  dextrose 5 %-0.45 % sodium chloride infusion (has no administration in time range)  insulin regular bolus via infusion 0-10 Units (has no administration in time range)  insulin regular (NOVOLIN R,HUMULIN R) 100 Units in sodium chloride 0.9 % 100 mL (1 Units/mL) infusion (has no administration in time range)  dextrose 50 % solution 25 mL (has no administration in time range)  0.9 %  sodium chloride infusion (has no administration in time range)  sodium chloride 0.9 % bolus 1,000 mL (1,000 mLs Intravenous New Bag/Given 10/19/17 1216)     Initial Impression / Assessment and Plan / ED Course  I have reviewed the triage vital signs and the nursing notes.  Pertinent labs & imaging results that were available during my care of the patient were reviewed by me and considered in my medical decision making (see chart for details).  MDM Reviewed: previous chart, nursing note and vitals Reviewed previous: labs and ECG Interpretation: labs, ECG and x-ray Total time providing critical care: 30-74 minutes. This excludes time spent performing separately reportable procedures and services. Consults: admitting MD   CRITICAL CARE Performed by: Samuel Jester Total critical care time: 35 minutes Critical care time was exclusive of separately billable procedures and treating other patients. Critical care was necessary to treat or prevent imminent or life-threatening deterioration. Critical care was time spent personally by me on the following activities: development of treatment plan with patient and/or surrogate as well as nursing, discussions with consultants, evaluation of patient's response to treatment,  examination of patient, obtaining history from patient or surrogate, ordering and performing treatments and interventions, ordering and review of laboratory studies, ordering and review of radiographic studies, pulse oximetry and re-evaluation of patient's condition.   Results for orders placed or performed during the hospital encounter of 10/19/17  CBC with Differential  Result Value Ref Range   WBC 9.7 4.0 - 10.5 K/uL   RBC 4.40 4.22 - 5.81 MIL/uL   Hemoglobin 12.6 (L) 13.0 - 17.0 g/dL   HCT 60.4 (L) 54.0 - 98.1 %   MCV 85.5 78.0 - 100.0 fL   MCH 28.6 26.0 - 34.0 pg   MCHC 33.5 30.0 - 36.0 g/dL   RDW 19.1 47.8 - 29.5 %   Platelets 337 150 - 400 K/uL   Neutrophils Relative % 70 %   Neutro Abs 6.9 1.7 - 7.7 K/uL   Lymphocytes Relative 22 %   Lymphs Abs 2.1 0.7 - 4.0 K/uL   Monocytes Relative 8 %   Monocytes Absolute 0.7 0.1 - 1.0 K/uL   Eosinophils Relative 0 %   Eosinophils Absolute 0.0 0.0 - 0.7 K/uL   Basophils Relative 0 %   Basophils Absolute 0.0 0.0 - 0.1 K/uL  Urinalysis, Routine w reflex microscopic  Result Value Ref Range   Color, Urine YELLOW YELLOW   APPearance CLEAR CLEAR   Specific Gravity, Urine  1.022 1.005 - 1.030   pH 5.0 5.0 - 8.0   Glucose, UA >=500 (A) NEGATIVE mg/dL   Hgb urine dipstick SMALL (A) NEGATIVE   Bilirubin Urine NEGATIVE NEGATIVE   Ketones, ur 20 (A) NEGATIVE mg/dL   Protein, ur NEGATIVE NEGATIVE mg/dL   Nitrite NEGATIVE NEGATIVE   Leukocytes, UA NEGATIVE NEGATIVE   RBC / HPF 0-5 0 - 5 RBC/hpf   WBC, UA 0-5 0 - 5 WBC/hpf   Bacteria, UA NONE SEEN NONE SEEN   Mucus PRESENT    Hyaline Casts, UA PRESENT   Comprehensive metabolic panel  Result Value Ref Range   Sodium 129 (L) 135 - 145 mmol/L   Potassium 4.4 3.5 - 5.1 mmol/L   Chloride 88 (L) 98 - 111 mmol/L   CO2 22 22 - 32 mmol/L   Glucose, Bld 697 (HH) 70 - 99 mg/dL   BUN 42 (H) 6 - 20 mg/dL   Creatinine, Ser 7.93 (H) 0.61 - 1.24 mg/dL   Calcium 9.1 8.9 - 96.8 mg/dL   Total Protein 7.7  6.5 - 8.1 g/dL   Albumin 4.1 3.5 - 5.0 g/dL   AST 36 15 - 41 U/L   ALT 19 0 - 44 U/L   Alkaline Phosphatase 84 38 - 126 U/L   Total Bilirubin 1.2 0.3 - 1.2 mg/dL   GFR calc non Af Amer 29 (L) >60 mL/min   GFR calc Af Amer 33 (L) >60 mL/min   Anion gap 19 (H) 5 - 15  Lipase, blood  Result Value Ref Range   Lipase 59 (H) 11 - 51 U/L  Troponin I  Result Value Ref Range   Troponin I <0.03 <0.03 ng/mL  CBG monitoring, ED  Result Value Ref Range   Glucose-Capillary >600 (HH) 70 - 99 mg/dL    Dg Abd Acute W/chest Result Date: 10/19/2017 CLINICAL DATA:  Nausea and vomiting. EXAM: DG ABDOMEN ACUTE W/ 1V CHEST COMPARISON:  01/06/2009. FINDINGS: Mediastinum hilar structures normal. Heart size stable. No pulmonary venous congestion. Low lung volumes with mild basilar atelectasis. No pleural effusion or pneumothorax. Air-filled loops of small and large bowel are noted. Small-bowel fold thickening noted. An active small-bowel process including enteritis or ischemia could present in this fashion. Follow-up exam to demonstrate resolution and to exclude developing bowel obstruction suggested. No free air. IMPRESSION: 1. Air-filled loops of small and large bowel noted. Fold thickening noted within the small bowel. Active small-bowel process including enteritis or ischemia could present in this fashion. Follow-up exam suggested demonstrate resolution and to exclude developing small bowel distention. 2.  Low lung volumes with mild bibasilar atelectasis. Electronically Signed   By: Maisie Fus  Register   On: 10/19/2017 13:59     Results for REAGAN, SASS (MRN [id number]) as of 10/19/2017 13:29  Ref. Range 01/06/2009 04:00 10/01/2009 17:50 06/30/2010 05:34 08/04/2016 05:10 10/19/2017 11:57  BUN Latest Ref Range: 6 - 20 mg/dL 21 12 11 15  42 (H)  Creatinine Latest Ref Range: 0.61 - 1.24 mg/dL 2.18 2.88 3.37 4.45 (H) 2.43 (H)     1310:  BUN/Cr elevated from previous. IVF NS bolus x2L given. Na corrects to 139 for  hyperglycemia. AG 19. IV insulin gtt started. Abd remains benign, no N/V while in the ED.  T/C returned from [facility] Dr. Blake Divine, case discussed, including:  HPI, pertinent PM/SHx, VS/PE, dx testing, ED course and treatment:  Agreeable to admit.    Final Clinical Impressions(s) / ED Diagnoses   Final diagnoses:  None  ED Discharge Orders    None       Samuel Jester, DO 10/23/17 [id number]

## 2017-10-19 NOTE — Progress Notes (Signed)
Inpatient Diabetes Program Recommendations  AACE/ADA: New Consensus Statement on Inpatient Glycemic Control (2019)  Target Ranges:  Prepandial:   less than 140 mg/dL      Peak postprandial:   less than 180 mg/dL (1-2 hours)      Critically ill patients:  140 - 180 mg/dL  Results for OTTAVIO, BITZ (MRN [id number]) as of 10/19/2017 13:14  Ref. Range 10/19/2017 11:57  Glucose Latest Ref Range: 70 - 99 mg/dL [facility])   Results for MIKAH, FRONHEISER (MRN [id number]) as of 10/19/2017 13:14  Ref. Range 10/19/2017 11:54  Glucose-Capillary Latest Ref Range: 70 - 99 mg/dL >578 (HH)   Review of Glycemic Control  Diabetes history: DM2 Outpatient Diabetes medications: Unknown  Current orders for Inpatient glycemic control: IV insulin drip  Inpatient Diabetes Program Recommendations:  HgbA1C: Please consider ordering an A1C to evaluate glycemic control over the past 2-3 months.  NOTE: In reviewing chart, noted ED notes on 08/04/16 by Dr. Zenda Alpers and Dr. Fanny Bien. Per notes, patient had came to the ED with hypoglycemia (was found unresponsive at group home). Patient was on Metformin and Lantus 40 units BID at that time and patient was instructed to decrease Lantus dose by one fourth.    Thanks, Orlando Penner, RN, MSN, CDE Diabetes Coordinator Inpatient Diabetes Program [phone] (Team Pager from 8am to 5pm)

## 2017-10-19 NOTE — H&P (Signed)
History and Physical    DADRIAN DIETZ KKX:[id number] DOB: 10/03/1965 DOA: 10/19/2017  PCP: System, Pcp Not In  Patient coming from: Home  I have personally briefly reviewed patient's old medical records in [facility]  Chief Complaint: sent for abnormal high blood sugars.   HPI: Keith Ewing is a 52 y.o. male with medical history significant of insulin dependent diabetes Mellitus, reports having nausea and vomiting one week ago, persisted for 3 to 4 days , went to see his [place] doctor underwent blood work and was asked to come ED for elevated CBGS. Pt denies any nausea, or vomiting at this time. He denies fevers, chills, sob, chest pain, cough, headache, nausea, vomiting or abdominal pain, no diarrhea, dysuria, tingling or numbness of the lower extremities, or generalized weakness. On arriva lto ED, he was found to have cbg greater than 600, and anion gap of 19 , sodium of 129, . Hemoglobin of 12.6, creatinine of 2.43 and BUN OF 42. Lipase of 59. Normal liver function tests.  He was referred to medical service for admission for management of DKA. abd and cxr showed Air-filled loops of small and large bowel noted. Fold thickening noted within the small bowel. Active small-bowel process including enteritis or ischemia could present in this fashion. Follow-up exam suggested demonstrate resolution and to exclude developing small bowel distention.  UA is negative for infection.   Review of Systems: As per HPI otherwise 10 point review of systems negative.    Past Medical History:  Diagnosis Date  . Diabetes mellitus without complication [facility])     Past Surgical History:  Procedure Laterality Date  . APPENDECTOMY       reports that he has been smoking. He does not have any smokeless tobacco history on file. He reports that he does not drink alcohol. His drug history is not on file.  No Known Allergies  No family history.   Prior to Admission medications   Not on File    Physical  Exam: Vitals:   10/19/17 1300 10/19/17 1330 10/19/17 1400 10/19/17 1430  BP: 126/73 136/77 (!) 146/82 133/77  Pulse: 79 79 79 75  Resp: 16 17 16 18   Temp:    98.4 F (36.9 C)  TempSrc:    Oral  SpO2: 94% 97% 96% 96%  Weight:      Height:        Constitutional: NAD, calm, comfortable Vitals:   10/19/17 1300 10/19/17 1330 10/19/17 1400 10/19/17 1430  BP: 126/73 136/77 (!) 146/82 133/77  Pulse: 79 79 79 75  Resp: 16 17 16 18   Temp:    98.4 F (36.9 C)  TempSrc:    Oral  SpO2: 94% 97% 96% 96%  Weight:      Height:       Eyes: PERRL, lids and conjunctivae normal ENMT: Mucous membranes are moist. Posterior pharynx clear of any exudate or lesions.Normal dentition.  Neck: normal, supple, no masses, no thyromegaly Respiratory: clear to auscultation bilaterally, no wheezing, no crackles. Normal respiratory effort. No accessory muscle use.  Cardiovascular: Regular rate and rhythm, no murmurs / rubs / gallops. No extremity edema. 2+ pedal pulses. No carotid bruits.  Abdomen: no tenderness, no masses palpated. No hepatosplenomegaly. Bowel sounds positive.  Musculoskeletal: no clubbing / cyanosis. No joint deformity upper and lower extremities. Good ROM, no contractures. Normal muscle tone.  Skin: no rashes, lesions, ulcers. No induration Neurologic: CN 2-12 grossly intact. Sensation intact, DTR normal. Strength 5/5 in all 4.  Psychiatric: Normal judgment and insight. Alert and oriented x 3. Normal mood.     Labs on Admission: I have personally reviewed following labs and imaging studies  CBC: Recent Labs  Lab 10/19/17 1157  WBC 9.7  NEUTROABS 6.9  HGB 12.6*  HCT 37.6*  MCV 85.5  PLT 337   Basic Metabolic Panel: Recent Labs  Lab 10/19/17 1157  NA 129*  K 4.4  CL 88*  CO2 22  GLUCOSE 697*  BUN 42*  CREATININE 2.43*  CALCIUM 9.1   GFR: Estimated Creatinine Clearance: 32.9 mL/min (A) (by C-G formula based on SCr of 2.43 mg/dL (H)). Liver Function Tests: Recent Labs   Lab 10/19/17 1157  AST 36  ALT 19  ALKPHOS 84  BILITOT 1.2  PROT 7.7  ALBUMIN 4.1   Recent Labs  Lab 10/19/17 1157  LIPASE 59*   No results for input(s): AMMONIA in the last 168 hours. Coagulation Profile: No results for input(s): INR, PROTIME in the last 168 hours. Cardiac Enzymes: Recent Labs  Lab 10/19/17 1157  TROPONINI <0.03   BNP (last 3 results) No results for input(s): PROBNP in the last 8760 hours. HbA1C: No results for input(s): HGBA1C in the last 72 hours. CBG: Recent Labs  Lab 10/19/17 1154 10/19/17 1402  GLUCAP >600* 553*   Lipid Profile: No results for input(s): CHOL, HDL, LDLCALC, TRIG, CHOLHDL, LDLDIRECT in the last 72 hours. Thyroid Function Tests: No results for input(s): TSH, T4TOTAL, FREET4, T3FREE, THYROIDAB in the last 72 hours. Anemia Panel: No results for input(s): VITAMINB12, FOLATE, FERRITIN, TIBC, IRON, RETICCTPCT in the last 72 hours. Urine analysis:    Component Value Date/Time   COLORURINE YELLOW 10/19/2017 1137   APPEARANCEUR CLEAR 10/19/2017 1137   LABSPEC 1.022 10/19/2017 1137   PHURINE 5.0 10/19/2017 1137   GLUCOSEU >=500 (A) 10/19/2017 1137   HGBUR SMALL (A) 10/19/2017 1137   BILIRUBINUR NEGATIVE 10/19/2017 1137   KETONESUR 20 (A) 10/19/2017 1137   PROTEINUR NEGATIVE 10/19/2017 1137   UROBILINOGEN 0.2 10/01/2009 1723   NITRITE NEGATIVE 10/19/2017 1137   LEUKOCYTESUR NEGATIVE 10/19/2017 1137    Radiological Exams on Admission: Dg Abd Acute W/chest  Result Date: 10/19/2017 CLINICAL DATA:  Nausea and vomiting. EXAM: DG ABDOMEN ACUTE W/ 1V CHEST COMPARISON:  01/06/2009. FINDINGS: Mediastinum hilar structures normal. Heart size stable. No pulmonary venous congestion. Low lung volumes with mild basilar atelectasis. No pleural effusion or pneumothorax. Air-filled loops of small and large bowel are noted. Small-bowel fold thickening noted. An active small-bowel process including enteritis or ischemia could present in this  fashion. Follow-up exam to demonstrate resolution and to exclude developing bowel obstruction suggested. No free air. IMPRESSION: 1. Air-filled loops of small and large bowel noted. Fold thickening noted within the small bowel. Active small-bowel process including enteritis or ischemia could present in this fashion. Follow-up exam suggested demonstrate resolution and to exclude developing small bowel distention. 2.  Low lung volumes with mild bibasilar atelectasis. Electronically Signed   By: Maisie Fus  Register   On: 10/19/2017 13:59    EKG: Independently reviewed. Sinus rhythm  Assessment/Plan Active Problems:   DKA (diabetic ketoacidoses) (HCC)   Diabetic ketoacidosis:  Started on IV insulin, IV fluids, replace k to keep it greater than 4.  BMP every 8 hours till gap closes.  After that pt can go back to lantus 40 units BID with SSI.  Pt currently denies any nausea, vomiting or abdominal pain.  Started him clears and advance to cab modified diet for dinner  if able to tolerate clears.    Nausea vomiting  Resolved.  Possibly secondary to mild pancreatitis.  CT abd and pelvis without contrast ordered.  Prn IV zofran .  Gentle hydration.     AKI:  PTs baseline creatinine appears to be less than 1.  Probably from dehydration and DKA>  Hydrate and repeat renal parameters in am.  If creatinine does not improve with IV fluids, please get US RENAL to evaluate for hydronephrosis.  UA is negative for infection.  Get urine sodium and urine creatinine.   Hyponatremia: from hyperglycemia.  Repeat in am.    DVT prophylaxis: lovenox Code Status: full code.  Family Communication: none at bedside.  Disposition Plan:pending resolution of DKA.  Consults called: NONE Admission status: obs / SDU   Kathlen Mody MD [facility] Pager [phone]  If 7PM-7AM, please contact night-coverage [email or web address] Password [email or web address]  10/19/2017, 3:02 PM

## 2017-10-19 NOTE — ED Notes (Signed)
CBG reading high and was told to come to ED by EDP. PT states some nausea the past few days but denies any complaints today.

## 2017-10-19 NOTE — ED Notes (Signed)
Date and time results received: 10/19/17 1300 (use smartphrase ".now" to insert current time)  Test: Glucose Blood Critical Value: 697  Name of Provider Notified: Clarene Duke  Orders Received? Or Actions Taken?: No new orders given

## 2017-10-19 NOTE — ED Triage Notes (Signed)
Pt reports he had blood work at [employer] office this morning and was told his glucose was over 700.  Pt denies any symptoms.

## 2017-10-19 NOTE — ED Notes (Signed)
CRITICAL VALUE ALERT  Critical Value:  cbg HI  Date & Time Notied:  10/19/2017, 1214 Provider Notified: Dr. Clarene Duke  Orders Received/Actions taken: see chart

## 2017-10-20 DIAGNOSIS — E1310 Other specified diabetes mellitus with ketoacidosis without coma: Secondary | ICD-10-CM | POA: Diagnosis not present

## 2017-10-20 DIAGNOSIS — N179 Acute kidney failure, unspecified: Secondary | ICD-10-CM | POA: Diagnosis not present

## 2017-10-20 DIAGNOSIS — E871 Hypo-osmolality and hyponatremia: Secondary | ICD-10-CM | POA: Diagnosis not present

## 2017-10-20 LAB — GLUCOSE, CAPILLARY
GLUCOSE-CAPILLARY: 68 mg/dL — AB (ref 70–99)
GLUCOSE-CAPILLARY: 96 mg/dL (ref 70–99)
GLUCOSE-CAPILLARY: 95 mg/dL (ref 70–99)
Glucose-Capillary: 114 mg/dL — ABNORMAL HIGH (ref 70–99)
Glucose-Capillary: 158 mg/dL — ABNORMAL HIGH (ref 70–99)
Glucose-Capillary: 68 mg/dL — ABNORMAL LOW (ref 70–99)

## 2017-10-20 LAB — BASIC METABOLIC PANEL
Anion gap: 11 (ref 5–15)
BUN: 23 mg/dL — ABNORMAL HIGH (ref 6–20)
CO2: 29 mmol/L (ref 22–32)
Calcium: 8.5 mg/dL — ABNORMAL LOW (ref 8.9–10.3)
Chloride: 101 mmol/L (ref 98–111)
Creatinine, Ser: 1.51 mg/dL — ABNORMAL HIGH (ref 0.61–1.24)
GFR calc Af Amer: 59 mL/min — ABNORMAL LOW (ref >60)
GFR calc non Af Amer: 51 mL/min — ABNORMAL LOW (ref >60)
Glucose, Bld: 73 mg/dL (ref 70–99)
Potassium: 3.5 mmol/L (ref 3.5–5.1)
Sodium: 141 mmol/L (ref 135–145)

## 2017-10-20 LAB — CBC
HCT: 34.1 % — ABNORMAL LOW (ref 39.0–52.0)
Hemoglobin: 11 g/dL — ABNORMAL LOW (ref 13.0–17.0)
MCH: 27.4 pg (ref 26.0–34.0)
MCHC: 32.3 g/dL (ref 30.0–36.0)
MCV: 85 fL (ref 78.0–100.0)
Platelets: 322 10*3/uL (ref 150–400)
RBC: 4.01 MIL/uL — ABNORMAL LOW (ref 4.22–5.81)
RDW: 12.2 % (ref 11.5–15.5)
WBC: 7.7 10*3/uL (ref 4.0–10.5)

## 2017-10-20 LAB — HIV ANTIBODY (ROUTINE TESTING W REFLEX): HIV Screen 4th Generation wRfx: NONREACTIVE

## 2017-10-20 NOTE — Progress Notes (Signed)
PIV removed without difficulty. AVS paperwork reviewed with pt and family. Pt escorted to private vehicle via w/c. VSS at time of d/c.

## 2017-10-21 LAB — GLUCOSE, CAPILLARY: Glucose-Capillary: 507 mg/dL (ref 70–99)

## 2017-10-21 NOTE — Discharge Summary (Signed)
Physician Discharge Summary  Keith Ewing JYN:[id number] DOB: 1965/05/10 DOA: 10/19/2017  PCP: System, Pcp Not In  Admit date: 10/19/2017 Discharge date: 9//72019  Admitted From: Home Disposition: Home  Recommendations for Outpatient Follow-up:  1. Follow up with PCP in 1-2 weeks 2. Please obtain BMP/CBC in one week  Discharge Condition:Stable,  CODE STATUS: FULL Diet recommendation: Heart Healthy / Carb Modified   Brief/Interim Summary:  Keith Ewing is a 52 y.o. male with medical history significant of insulin dependent diabetes Mellitus, reports having nausea and vomiting one week ago, persisted for 3 to 4 days , went to see his [place] doctor underwent blood work and was asked to come ED for elevated CBGS. He was referred to medical service for admission for management of DKA.   Discharge Diagnoses:  Active Problems:   DKA (diabetic ketoacidoses) (HCC)   AKI (acute kidney injury) (HCC)   Hyponatremia   Diabetes (HCC)  Diabetic ketoacidosis:  Started on IV insulin, IV fluids, pt's anion gap closes.  Patient was restarted on  lantus 40 units BID with SSI.  Pt currently denies any nausea, vomiting or abdominal pain.  Started him clears and advanced to cab modified diet   A1c os 9.8. CBG (last 3)  Recent Labs    10/20/17 0445 10/20/17 0808 10/20/17 1218  GLUCAP 96 95 158*      Nausea vomiting  Resolved.  Possibly secondary to mild pancreatitis.  CT abd and pelvis without contrast ordered, does nto show any acute changes.     AKI:  PTs baseline creatinine appears to be less than 1.  Probably from dehydration and DKA>  Hydrate and repeat renal parameters in am show improvement.  UA is negative for infection.    Hyponatremia: from hyperglycemia.  Resolved.   Discharge Instructions  Discharge Instructions    Diet - low sodium heart healthy   Complete by:  As directed    Discharge instructions   Complete by:  As directed    Please follow up with PCP in  one week.  We have stopped your HCTZ AND metformin due to acute renal failure from dehydration.  Please check you r renal parameters in 2 days and if they are back to baseline , can be resumed as per your PCP.     Allergies as of 10/20/2017   No Known Allergies     Medication List    STOP taking these medications   hydrochlorothiazide 25 MG tablet Commonly known as:  HYDRODIURIL   metFORMIN 500 MG tablet Commonly known as:  GLUCOPHAGE     TAKE these medications   insulin aspart 100 UNIT/ML injection Commonly known as:  novoLOG Inject 6 Units into the skin 3 (three) times daily before meals. Pt takes on a sliding scale under 150 none over 151-200 6 units, 201-249 8 units, 250-300 10 units, 301 12 units   insulin glargine 100 UNIT/ML injection Commonly known as:  LANTUS Inject 40 Units into the skin 2 (two) times daily.   simvastatin 40 MG tablet Commonly known as:  ZOCOR Take 40 mg by mouth daily.      Follow-up Information    PCP Follow up in 2 day(s).          No Known Allergies  Consultations: None.  Procedures/Studies: Ct Abdomen Wo Contrast  Result Date: 10/19/2017 CLINICAL DATA:  Nausea, vomiting, pancreatitis with atypical presentation EXAM: CT ABDOMEN WITHOUT CONTRAST TECHNIQUE: Multidetector CT imaging of the abdomen was performed following the standard protocol  without IV contrast. Sagittal and coronal MPR images reconstructed from axial data set. No oral contrast administered COMPARISON:  None FINDINGS: Lower chest: Subsegmental atelectasis RIGHT lower lobe Hepatobiliary: Gallbladder and liver normal appearance Pancreas: Pancreas normal morphology. Peripancreatic tissue planes appear fairly well preserved. No pancreatic mass, ductal dilatation, calcification or pseudocyst. Spleen: Calcified granulomata within spleen. Small splenule at splenic hilum. Adrenals/Urinary Tract: Adrenal glands mildly thickened without discrete mass. Kidneys and visualized ureters  normal appearance. Stomach/Bowel: Stomach and visualized bowel loops unremarkable Vascular/Lymphatic: Atherosclerotic calcifications aorta and SMA. No adenopathy. Other: No free air or free fluid.  No hernia. Musculoskeletal: Unremarkable IMPRESSION: No acute upper abdominal abnormalities. 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 Aortic Atherosclerosis (ICD10-I70.0). Electronically Signed   By: Ulyses Southward M.D.   On: 10/19/2017 18:04   Dg Abd Acute W/chest  Result Date: 10/19/2017 CLINICAL DATA:  Nausea and vomiting. EXAM: DG ABDOMEN ACUTE W/ 1V CHEST COMPARISON:  01/06/2009. FINDINGS: Mediastinum hilar structures normal. Heart size stable. No pulmonary venous congestion. Low lung volumes with mild basilar atelectasis. No pleural effusion or pneumothorax. Air-filled loops of small and large bowel are noted. Small-bowel fold thickening noted. An active small-bowel process including enteritis or ischemia could present in this fashion. Follow-up exam to demonstrate resolution and to exclude developing bowel obstruction suggested. No free air. IMPRESSION: 1. Air-filled loops of small and large bowel noted. Fold thickening noted within the small bowel. Active small-bowel process including enteritis or ischemia could present in this fashion. Follow-up exam suggested demonstrate resolution and to exclude developing small bowel distention. 2.  Low lung volumes with mild bibasilar atelectasis. Electronically Signed   By: Maisie Fus  Register   On: 10/19/2017 13:59       Subjective:  No new complaints.,  Discharge Exam: Vitals:   10/20/17 1200 10/20/17 1300  BP: (!) 124/94 134/77  Pulse: 60 (!) 54  Resp: 11 15  Temp: 98.3 F (36.8 C)   SpO2: 95% 97%   Vitals:   10/20/17 0900 10/20/17 1000 10/20/17 1200 10/20/17 1300  BP: 117/74 128/81 (!) 124/94 134/77  Pulse: 67 65 60 (!) 54  Resp: 16 11 11 15   Temp:   98.3 F (36.8 C)   TempSrc:   Oral   SpO2: 94% 97% 95% 97%  Weight:      Height:         General: Pt is alert, awake, not in acute distress Cardiovascular: RRR, S1/S2 +, no rubs, no gallops Respiratory: CTA bilaterally, no wheezing, no rhonchi Abdominal: Soft, NT, ND, bowel sounds + Extremities: no edema, no cyanosis    The results of significant diagnostics from this hospitalization (including imaging, microbiology, ancillary and laboratory) are listed below for reference.     Microbiology: Recent Results (from the past 240 hour(s))  MRSA PCR Screening     Status: None   Collection Time: 10/19/17  3:11 PM  Result Value Ref Range Status   MRSA by PCR NEGATIVE NEGATIVE Final    Comment:        The GeneXpert MRSA Assay (FDA approved for NASAL specimens only), is one component of a comprehensive MRSA colonization surveillance program. It is not intended to diagnose MRSA infection nor to guide or monitor treatment for MRSA infections. Performed at [facility], [address], [place], [place] [address]      Labs: BNP (last 3 results) No results for input(s): BNP in the last 8760 hours. Basic Metabolic Panel: Recent Labs  Lab 10/19/17 1157 10/19/17 2002 10/20/17 0417  NA 129* 140 141  K 4.4 3.8 3.5  CL 88* 102 101  CO2 22 31 29   GLUCOSE 697* 190* 73  BUN 42* 32* 23*  CREATININE 2.43* 1.83* 1.51*  CALCIUM 9.1 8.5* 8.5*   Liver Function Tests: Recent Labs  Lab 10/19/17 1157  AST 36  ALT 19  ALKPHOS 84  BILITOT 1.2  PROT 7.7  ALBUMIN 4.1   Recent Labs  Lab 10/19/17 1157  LIPASE 59*   No results for input(s): AMMONIA in the last 168 hours. CBC: Recent Labs  Lab 10/19/17 1157 10/20/17 0417  WBC 9.7 7.7  NEUTROABS 6.9  --   HGB 12.6* 11.0*  HCT 37.6* 34.1*  MCV 85.5 85.0  PLT 337 322   Cardiac Enzymes: Recent Labs  Lab 10/19/17 1157  TROPONINI <0.03   BNP: Invalid input(s): POCBNP CBG: Recent Labs  Lab 10/20/17 0421 10/20/17 0423 10/20/17 0445 10/20/17 0808 10/20/17 1218  GLUCAP 68* 68* 96 95 158*   D-Dimer No  results for input(s): DDIMER in the last 72 hours. Hgb A1c Recent Labs    10/19/17 1157  HGBA1C 9.8*   Lipid Profile No results for input(s): CHOL, HDL, LDLCALC, TRIG, CHOLHDL, LDLDIRECT in the last 72 hours. Thyroid function studies No results for input(s): TSH, T4TOTAL, T3FREE, THYROIDAB in the last 72 hours.  Invalid input(s): FREET3 Anemia work up No results for input(s): VITAMINB12, FOLATE, FERRITIN, TIBC, IRON, RETICCTPCT in the last 72 hours. Urinalysis    Component Value Date/Time   COLORURINE YELLOW 10/19/2017 1137   APPEARANCEUR CLEAR 10/19/2017 1137   LABSPEC 1.022 10/19/2017 1137   PHURINE 5.0 10/19/2017 1137   GLUCOSEU >=500 (A) 10/19/2017 1137   HGBUR SMALL (A) 10/19/2017 1137   BILIRUBINUR NEGATIVE 10/19/2017 1137   KETONESUR 20 (A) 10/19/2017 1137   PROTEINUR NEGATIVE 10/19/2017 1137   UROBILINOGEN 0.2 10/01/2009 1723   NITRITE NEGATIVE 10/19/2017 1137   LEUKOCYTESUR NEGATIVE 10/19/2017 1137   Sepsis Labs Invalid input(s): PROCALCITONIN,  WBC,  LACTICIDVEN Microbiology Recent Results (from the past 240 hour(s))  MRSA PCR Screening     Status: None   Collection Time: 10/19/17  3:11 PM  Result Value Ref Range Status   MRSA by PCR NEGATIVE NEGATIVE Final    Comment:        The GeneXpert MRSA Assay (FDA approved for NASAL specimens only), is one component of a comprehensive MRSA colonization surveillance program. It is not intended to diagnose MRSA infection nor to guide or monitor treatment for MRSA infections. Performed at [facility], [address] [place], [place] [address]      Time coordinating discharge: 35 minutes  SIGNED:   Kathlen Mody, MD  [facility] 10/21/2017, 1:25 AM Pager   If 7PM-7AM, please contact night-coverage [email or web address] Password [email or web address]

## 2020-05-12 IMAGING — CT CT ABDOMEN W/O CM
2 of 4 series · 17 of 46 positions shown, 19 images · non-contrast
Comparison: None

CLINICAL DATA: Nausea, vomiting, pancreatitis with atypical
presentation

EXAM:
CT ABDOMEN WITHOUT CONTRAST
TECHNIQUE: Multidetector CT imaging of the abdomen was performed following the
standard protocol without IV contrast. Sagittal and coronal MPR
images reconstructed from axial data set. No oral contrast
administered

[Series 2: axial st · axial · 0.68mm/px · z∈[+1156,+1471]mm · 14 of 71 slices shown, 16 images]
[im 4/71  soft-tissue]
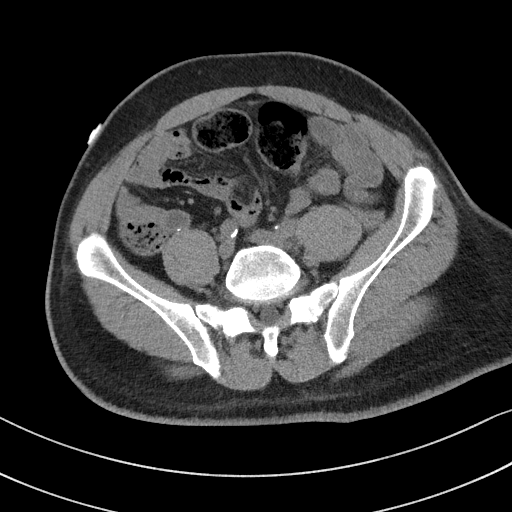
[im 4/71  bone]
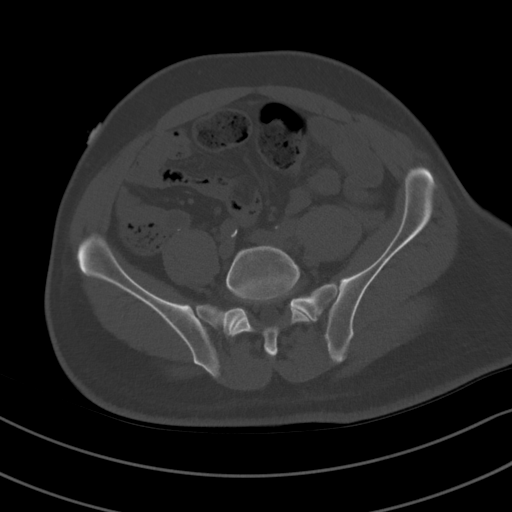
[im 11/71  soft-tissue]
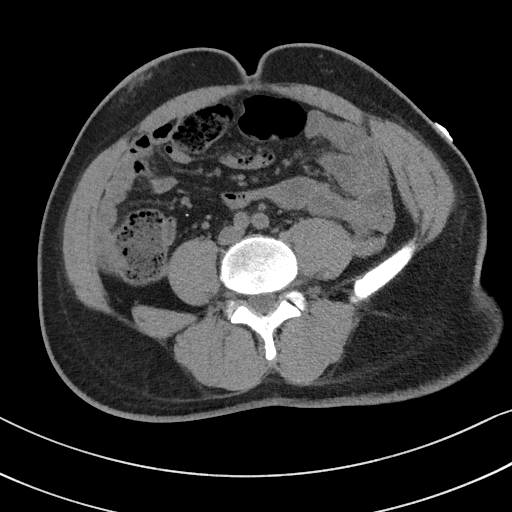
[im 14/71  soft-tissue]
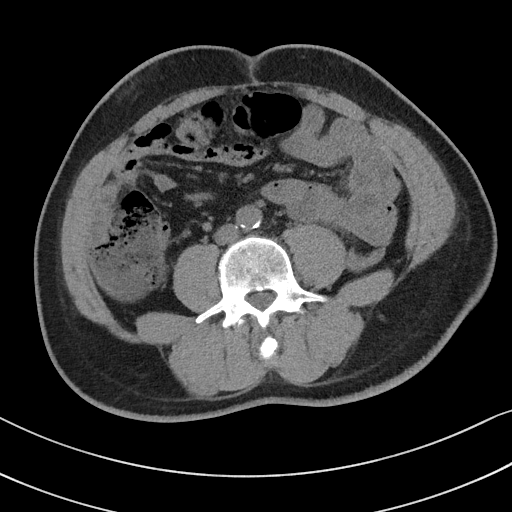
[im 21/71  soft-tissue]
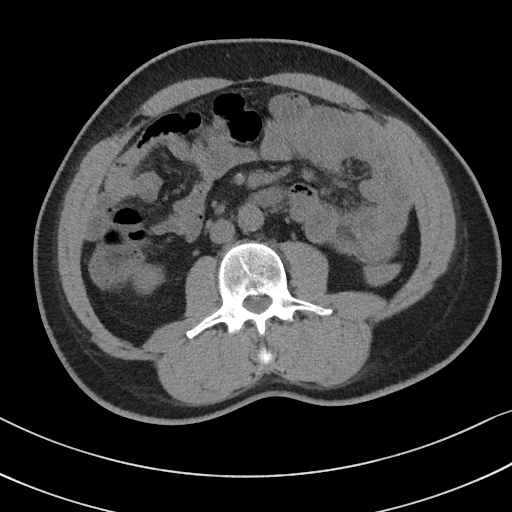
[im 24/71  soft-tissue]
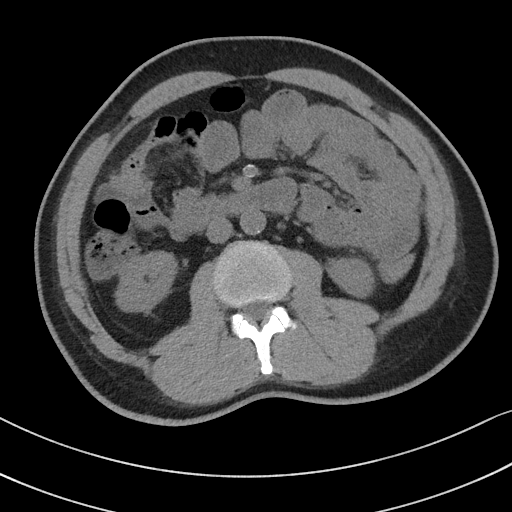
[im 27/71  soft-tissue]
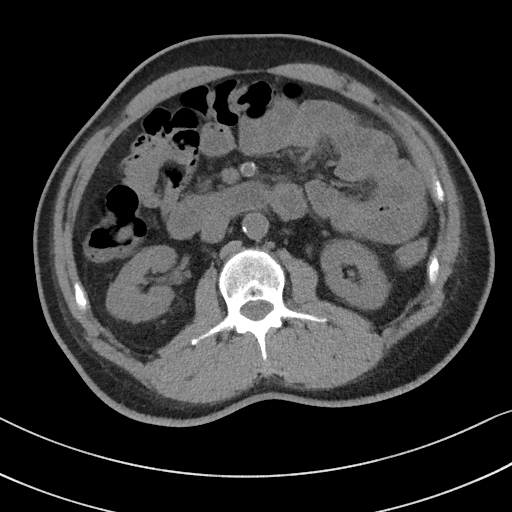
[im 34/71  soft-tissue]
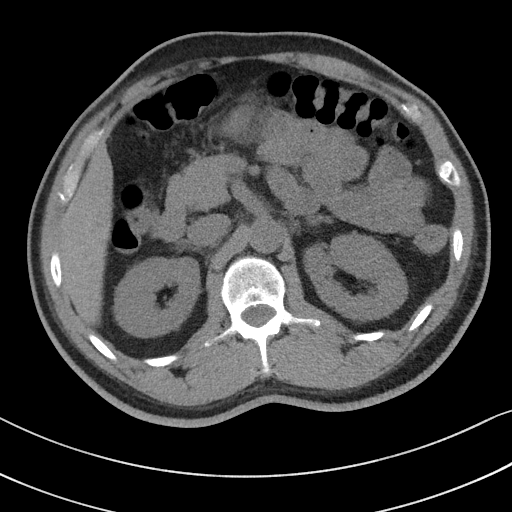
[im 37/71  soft-tissue]
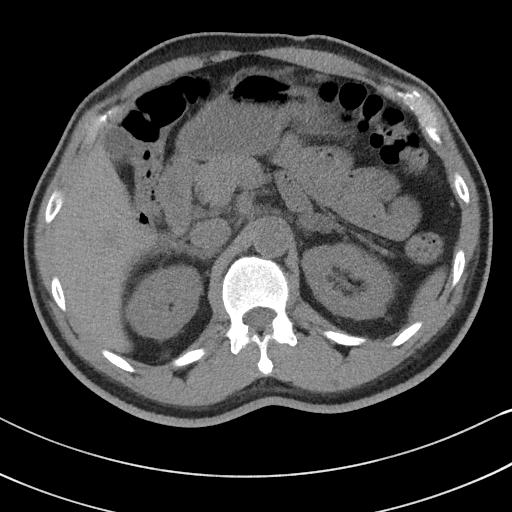
[im 44/71  soft-tissue]
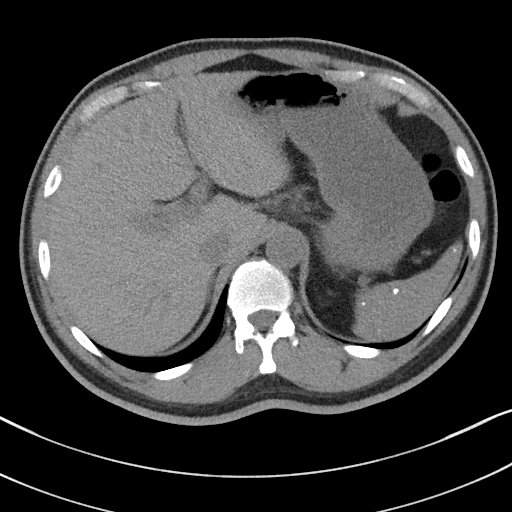
[im 44/71  bone]
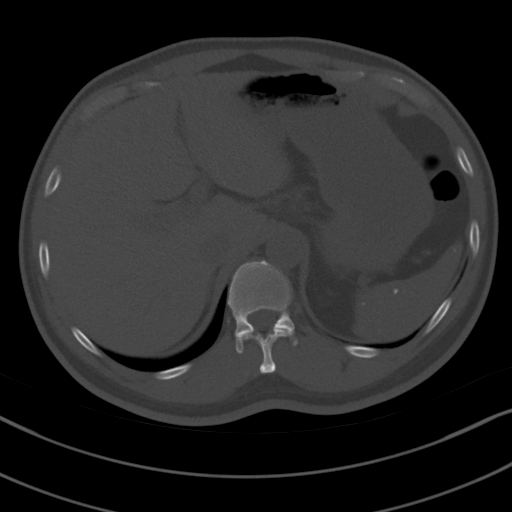
[im 47/71  soft-tissue]
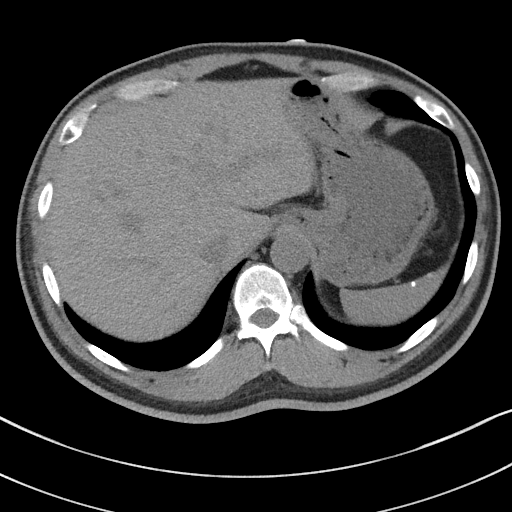
[im 54/71  soft-tissue]
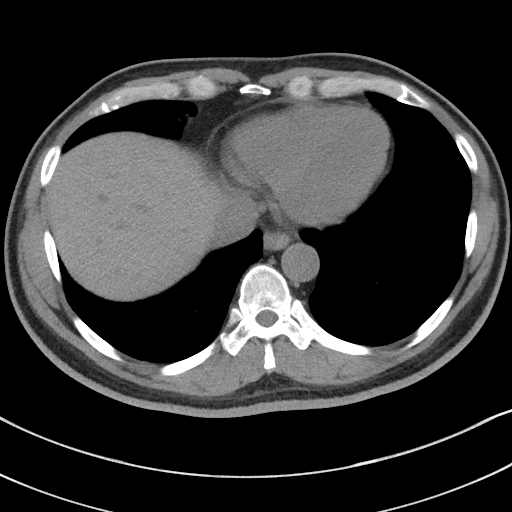
[im 57/71  soft-tissue]
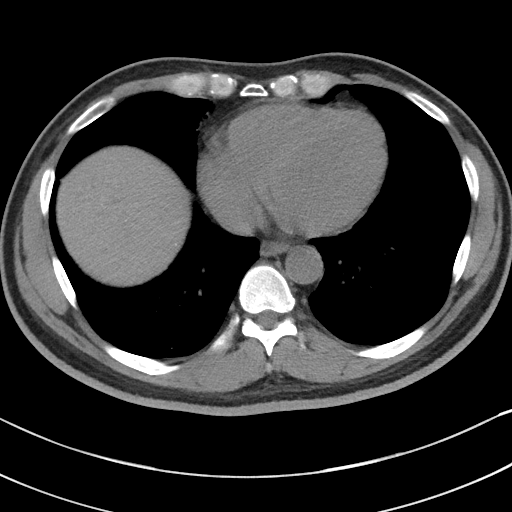
[im 61/71  soft-tissue]
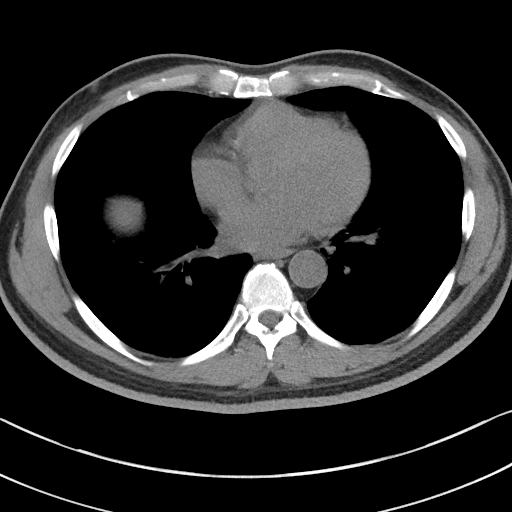
[im 67/71  soft-tissue]
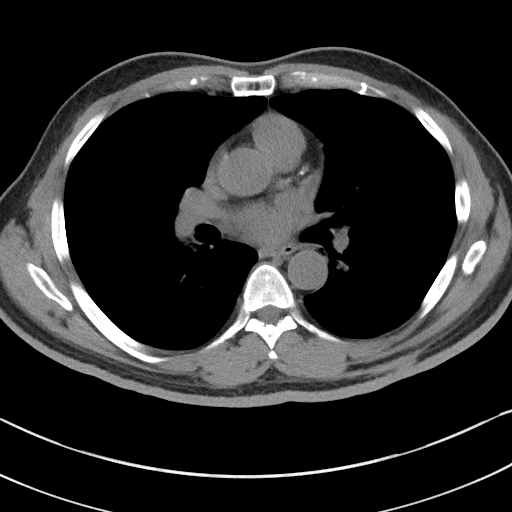

[Series 5: coronal st · coronal · 0.68mm/px · 3 of 89 slices shown]
[im 30/89  soft-tissue]
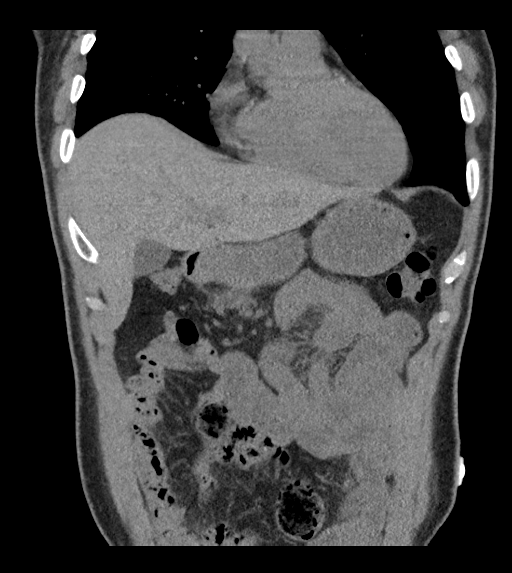
[im 40/89  soft-tissue]
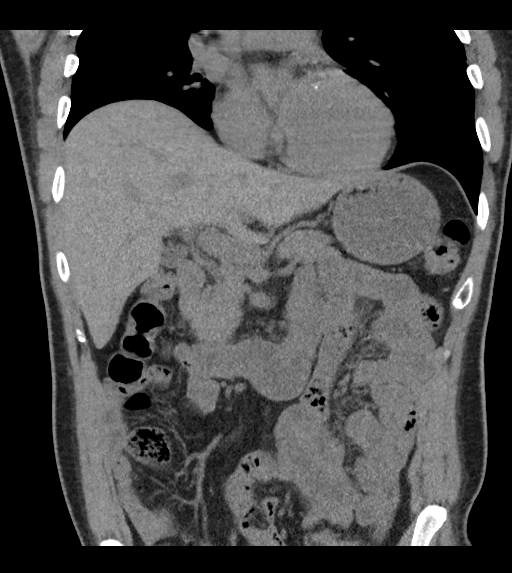
[im 49/89  soft-tissue]
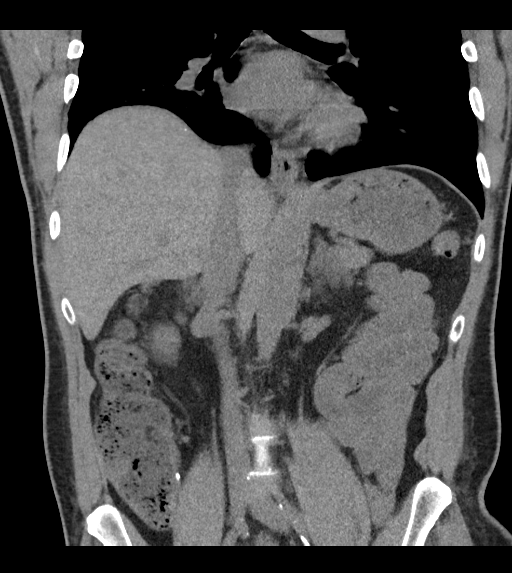

[17 of 46 positions shown; findings below may reference images not displayed]

FINDINGS: Lower chest: Subsegmental atelectasis RIGHT lower lobe

Hepatobiliary: Gallbladder and liver normal appearance

Pancreas: Pancreas normal morphology. Peripancreatic tissue planes
appear fairly well preserved. No pancreatic mass, ductal dilatation,
calcification or pseudocyst.

Spleen: Calcified granulomata within spleen. Small splenule at
splenic hilum.

Adrenals/Urinary Tract: Adrenal glands mildly thickened without
discrete mass. Kidneys and visualized ureters normal appearance.

Stomach/Bowel: Stomach and visualized bowel loops unremarkable

Vascular/Lymphatic: Atherosclerotic calcifications aorta and SMA. No
adenopathy.

Other: No free air or free fluid.  No hernia.

Musculoskeletal: Unremarkable
IMPRESSION: No acute upper abdominal abnormalities.

Old granulomatous disease changes of the spleen.

Aortic Atherosclerosis ([id number]).

## 2020-05-12 IMAGING — DX DG ABDOMEN ACUTE W/ 1V CHEST
3 series · 3 of 3 positions shown · non-contrast
Comparison: 01/06/2009.

CLINICAL DATA: Nausea and vomiting.

EXAM:
DG ABDOMEN ACUTE W/ 1V CHEST

[chest pa]
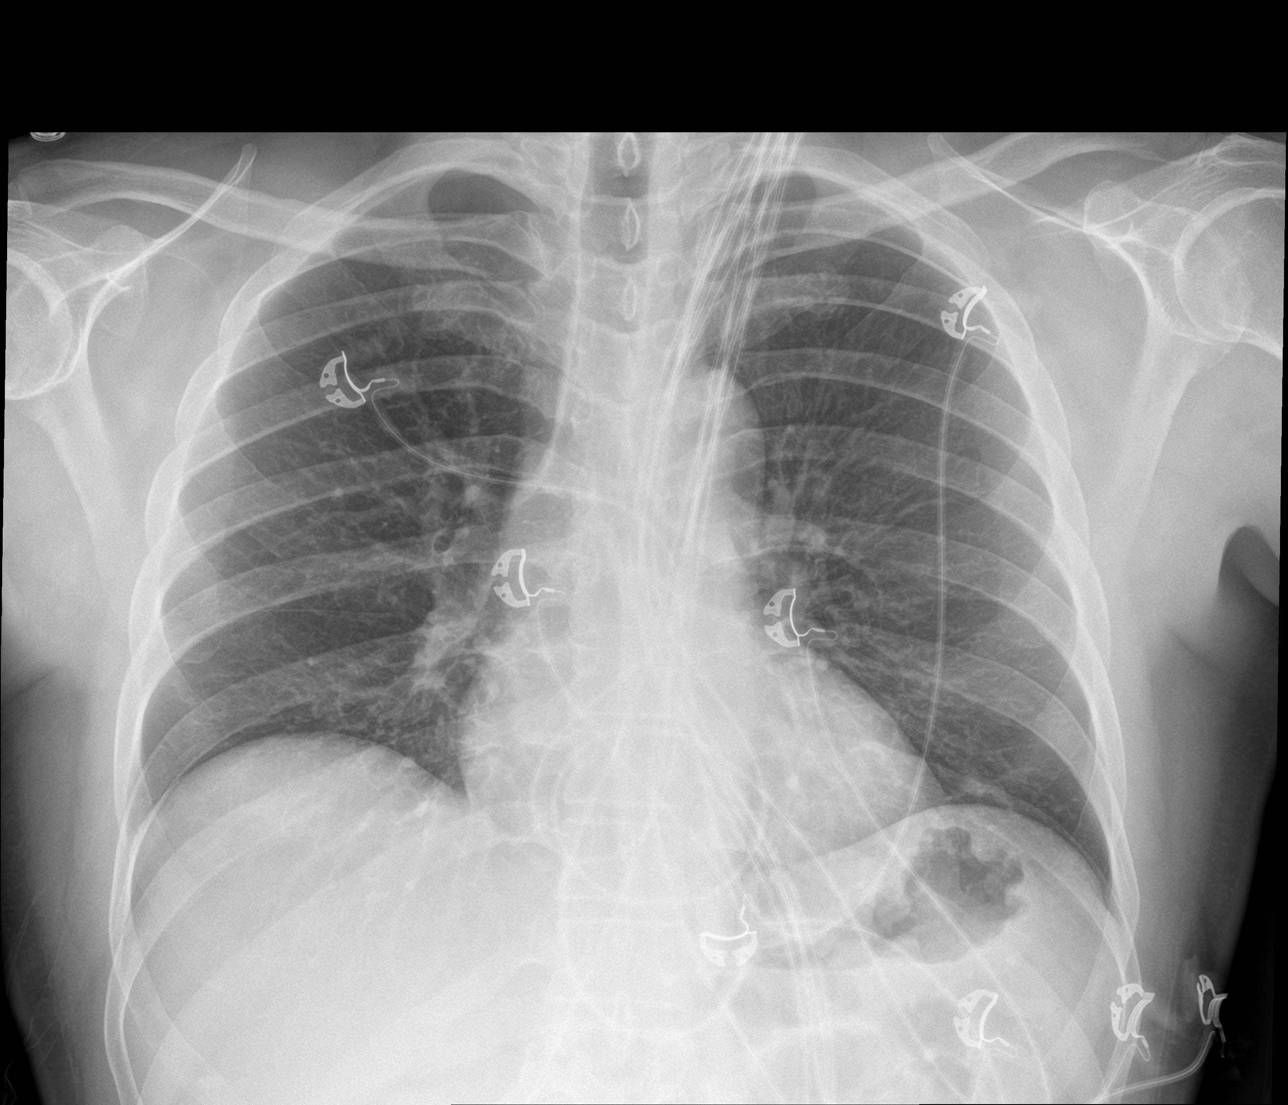

[abdomen erect]
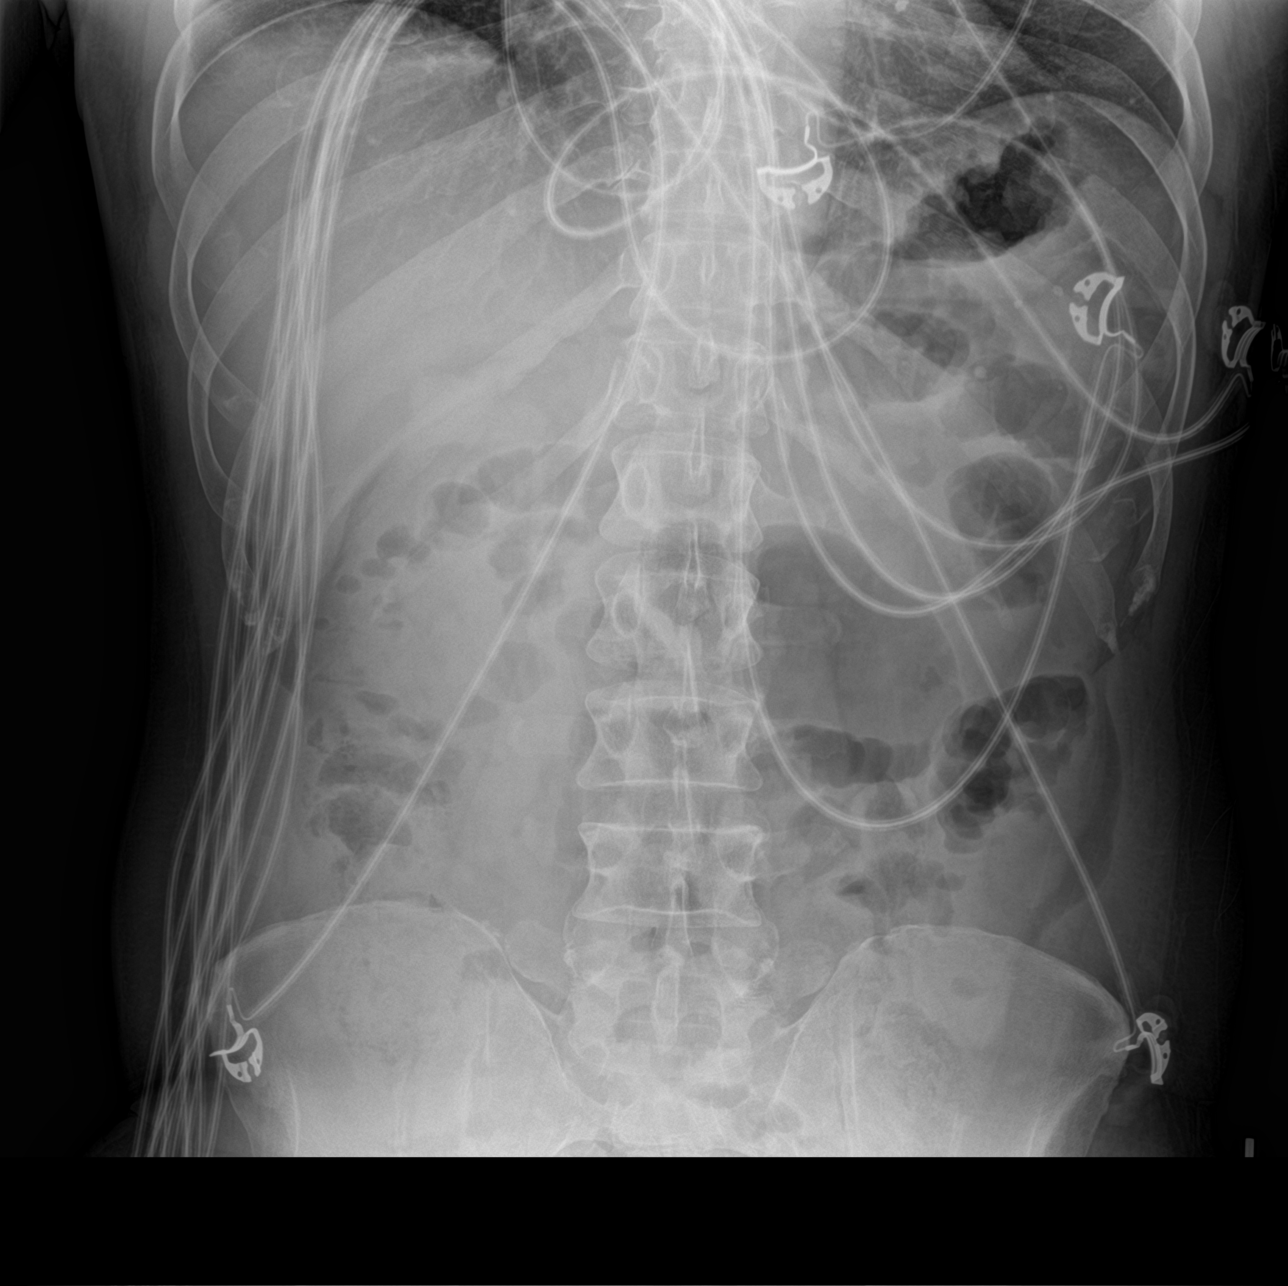

[abdomen supine]
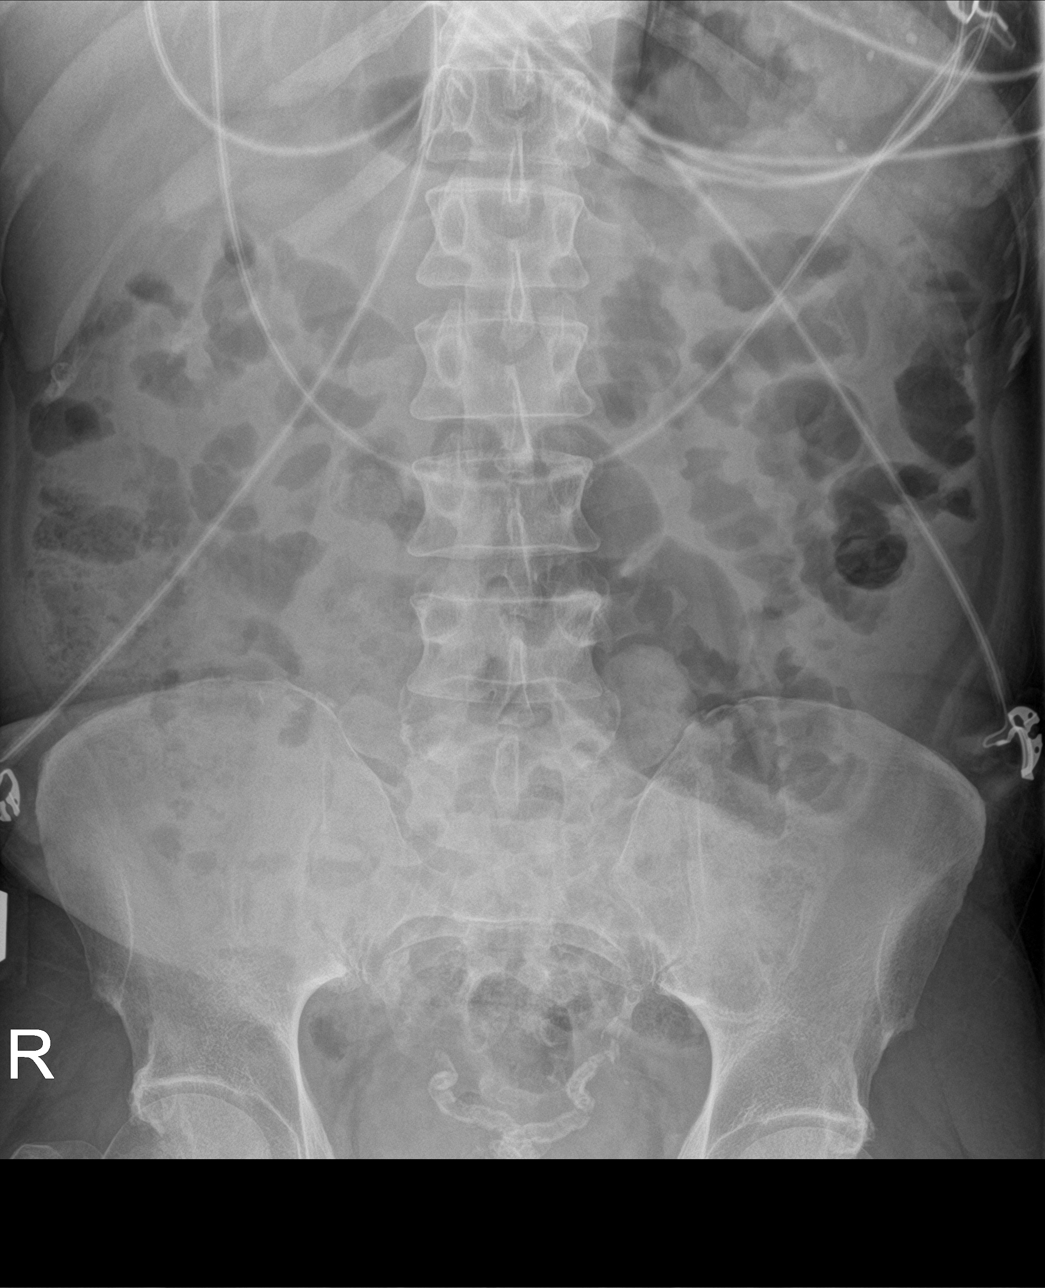

[3 of 3 positions shown; findings below may reference images not displayed]

FINDINGS: Mediastinum hilar structures normal. Heart size stable. No pulmonary
venous congestion. Low lung volumes with mild basilar atelectasis.
No pleural effusion or pneumothorax. Air-filled loops of small and
large bowel are noted. Small-bowel fold thickening noted. An active
small-bowel process including enteritis or ischemia could present in
this fashion. Follow-up exam to demonstrate resolution and to
exclude developing bowel obstruction suggested. No free air.
IMPRESSION: 1. Air-filled loops of small and large bowel noted. Fold thickening
noted within the small bowel. Active small-bowel process including
enteritis or ischemia could present in this fashion. Follow-up exam
suggested demonstrate resolution and to exclude developing small
bowel distention.

2.  Low lung volumes with mild bibasilar atelectasis.

## 2021-10-05 ENCOUNTER — Other Ambulatory Visit: Payer: Self-pay | Admitting: *Deleted

## 2021-10-05 NOTE — Patient Outreach (Signed)
  Care Coordination   10/05/2021  Name: ISAIHA ASARE MRN: [id number] DOB: September 02, 1965   Care Coordination Outreach Attempts:  An unsuccessful telephone outreach was attempted today to offer the patient information about available care coordination services as a benefit of their health plan. HIPAA compliant message left on voicemail, providing contact information for CSW, encouraging patient to return CSW's call at his earliest convenience.  Follow Up Plan:  Additional outreach attempts will be made to offer the patient care coordination information and services.   Encounter Outcome:  No Answer.   Care Coordination Interventions Activated:  No.    Care Coordination Interventions:  No, not indicated.    Danford Bad, BSW, MSW, LCSW  Licensed Restaurant manager, [employer] [facility]  Mailing [place] [address], [place], [place] [address] Physical Address[address] [place], [place] [address] Toll Free Main # [phone] Fax # [phone] Cell # [phone] [email or web address]

## 2021-10-25 ENCOUNTER — Other Ambulatory Visit: Payer: Self-pay | Admitting: *Deleted

## 2021-10-25 NOTE — Patient Outreach (Signed)
  Care Coordination   10/25/2021  Name: Keith Ewing MRN: [id number] DOB: 11/19/1965   Care Coordination Outreach Attempts:  A second unsuccessful outreach was attempted today to offer the patient with information about available care coordination services as a benefit of their health plan.   HIPAA compliant message left on voicemail, providing contact information for CSW, encouraging patient to return CSW's call at his earliest convenience.  Follow Up Plan:  Additional outreach attempts will be made to offer the patient care coordination information and services.   Encounter Outcome:  No Answer.   Care Coordination Interventions Activated:  No.    Care Coordination Interventions:  No, not indicated.    Danford Bad, BSW, MSW, LCSW  Licensed Restaurant manager, [employer] [facility]  Mailing [place] [address], [place], [place] [address] Physical Address[address] [place], [place] [address] Toll Free Main # [phone] Fax # [phone] Cell # [phone] [email or web address]

## 2021-11-01 ENCOUNTER — Other Ambulatory Visit: Payer: Self-pay | Admitting: *Deleted

## 2021-11-01 NOTE — Patient Outreach (Signed)
  Care Coordination   11/01/2021  Name: Keith Ewing MRN: [id number] DOB: 10-22-1965   Care Coordination Outreach Attempts:  A third unsuccessful outreach was attempted today to offer the patient with information about available care coordination services as a benefit of their health plan. HIPAA compliant message left on voicemail, providing contact information for CSW, encouraging patient to return CSW's call at his earliest convenience.  Follow Up Plan:  No further outreach attempts will be made at this time. We have been unable to contact the patient to offer or enroll patient in care coordination services.  Encounter Outcome:  No Answer.   Care Coordination Interventions Activated:  No.    Care Coordination Interventions:  No, not indicated.    Nat Christen, BSW, MSW, LCSW  Licensed Education officer, [employer]  Mailing [place] [address] [place], [place] [address] Physical Address-[address], [place], [place] [address] Toll Free Main # [phone] Fax # [phone] Cell # [phone] [email or web address]

## 2022-02-17 ENCOUNTER — Encounter (HOSPITAL_COMMUNITY): Payer: Self-pay | Admitting: Emergency Medicine

## 2022-02-17 ENCOUNTER — Other Ambulatory Visit: Payer: Self-pay

## 2022-02-17 ENCOUNTER — Emergency Department (HOSPITAL_COMMUNITY): Payer: No Typology Code available for payment source

## 2022-02-17 ENCOUNTER — Emergency Department (HOSPITAL_COMMUNITY)
Admission: EM | Admit: 2022-02-17 | Discharge: 2022-02-17 | Disposition: A | Discharge status: Home / Self Care | Payer: No Typology Code available for payment source | Attending: Emergency Medicine | Admitting: Emergency Medicine

## 2022-02-17 DIAGNOSIS — E119 Type 2 diabetes mellitus without complications: Secondary | ICD-10-CM

## 2022-02-17 DIAGNOSIS — E1122 Type 2 diabetes mellitus with diabetic chronic kidney disease: Secondary | ICD-10-CM | POA: Diagnosis not present

## 2022-02-17 DIAGNOSIS — R112 Nausea with vomiting, unspecified: Secondary | ICD-10-CM | POA: Diagnosis present

## 2022-02-17 DIAGNOSIS — N1832 Chronic kidney disease, stage 3b: Secondary | ICD-10-CM | POA: Insufficient documentation

## 2022-02-17 DIAGNOSIS — K5909 Other constipation: Secondary | ICD-10-CM | POA: Insufficient documentation

## 2022-02-17 DIAGNOSIS — E860 Dehydration: Secondary | ICD-10-CM | POA: Insufficient documentation

## 2022-02-17 DIAGNOSIS — N179 Acute kidney failure, unspecified: Secondary | ICD-10-CM | POA: Insufficient documentation

## 2022-02-17 LAB — CBC
HCT: 47 % (ref 39.0–52.0)
Hemoglobin: 14.6 g/dL (ref 13.0–17.0)
MCH: 27.5 pg (ref 26.0–34.0)
MCHC: 31.1 g/dL (ref 30.0–36.0)
MCV: 88.7 fL (ref 80.0–100.0)
Platelets: 342 10*3/uL (ref 150–400)
RBC: 5.3 MIL/uL (ref 4.22–5.81)
RDW: 12.5 % (ref 11.5–15.5)
WBC: 7.4 10*3/uL (ref 4.0–10.5)
nRBC: 0 % (ref 0.0–0.2)

## 2022-02-17 LAB — COMPREHENSIVE METABOLIC PANEL
ALT: 18 U/L (ref 0–44)
AST: 42 U/L — ABNORMAL HIGH (ref 15–41)
Albumin: 4 g/dL (ref 3.5–5.0)
Alkaline Phosphatase: 114 U/L (ref 38–126)
Anion gap: 12 (ref 5–15)
BUN: 23 mg/dL — ABNORMAL HIGH (ref 6–20)
CO2: 30 mmol/L (ref 22–32)
Calcium: 9.5 mg/dL (ref 8.9–10.3)
Chloride: 96 mmol/L — ABNORMAL LOW (ref 98–111)
Creatinine, Ser: 1.98 mg/dL — ABNORMAL HIGH (ref 0.61–1.24)
GFR, Estimated: 38 mL/min — ABNORMAL LOW (ref >60)
Glucose, Bld: 132 mg/dL — ABNORMAL HIGH (ref 70–99)
Potassium: 4.5 mmol/L (ref 3.5–5.1)
Sodium: 138 mmol/L (ref 135–145)
Total Bilirubin: 0.6 mg/dL (ref 0.3–1.2)
Total Protein: 7.8 g/dL (ref 6.5–8.1)

## 2022-02-17 LAB — URINALYSIS, ROUTINE W REFLEX MICROSCOPIC
Bacteria, UA: NONE SEEN
Bilirubin Urine: NEGATIVE
Glucose, UA: >=500 mg/dL — AB
Hgb urine dipstick: NEGATIVE
Ketones, ur: 5 mg/dL — AB
Leukocytes,Ua: NEGATIVE
Nitrite: NEGATIVE
Protein, ur: 30 mg/dL — AB
Specific Gravity, Urine: 1.03 (ref 1.005–1.030)
pH: 5 (ref 5.0–8.0)

## 2022-02-17 LAB — RAPID URINE DRUG SCREEN, HOSP PERFORMED
Amphetamines: NOT DETECTED
Barbiturates: NOT DETECTED
Benzodiazepines: NOT DETECTED
Cocaine: NOT DETECTED
Opiates: NOT DETECTED
Tetrahydrocannabinol: NOT DETECTED

## 2022-02-17 LAB — LIPASE, BLOOD: Lipase: 51 U/L (ref 11–51)

## 2022-02-17 LAB — BETA-HYDROXYBUTYRIC ACID: Beta-Hydroxybutyric Acid: 0.56 mmol/L — ABNORMAL HIGH (ref 0.05–0.27)

## 2022-02-17 LAB — CBG MONITORING, ED: Glucose-Capillary: 144 mg/dL — ABNORMAL HIGH (ref 70–99)

## 2022-02-17 MED ORDER — METOCLOPRAMIDE HCL 5 MG/ML IJ SOLN
10.0000 mg | Freq: Once | INTRAMUSCULAR | Status: AC
  Administered 2022-02-17: 10 mg via INTRAVENOUS
  Filled 2022-02-17: qty 2

## 2022-02-17 MED ORDER — LACTATED RINGERS IV BOLUS
1000.0000 mL | Freq: Once | INTRAVENOUS | Status: AC
  Administered 2022-02-17: 1000 mL via INTRAVENOUS

## 2022-02-17 MED ORDER — SODIUM CHLORIDE 0.9 % IV BOLUS
1000.0000 mL | Freq: Once | INTRAVENOUS | Status: AC
  Administered 2022-02-17: 1000 mL via INTRAVENOUS

## 2022-02-17 MED ORDER — ONDANSETRON HCL 4 MG/2ML IJ SOLN
4.0000 mg | Freq: Once | INTRAMUSCULAR | Status: AC
  Administered 2022-02-17: 4 mg via INTRAVENOUS
  Filled 2022-02-17: qty 2

## 2022-02-17 NOTE — ED Triage Notes (Addendum)
Pt presents with emesis and abdominal pain on Wednesday, per pt no history of GI problems.  FYI: Bedbug found crawling on pt, captured in placed in specimen cup.

## 2022-02-17 NOTE — Discharge Instructions (Addendum)
It was our pleasure to provide your ER care today - we hope that you feel better.  Drink plenty of fluids/stay well hydrated. Take zofran as need for nausea.  Your ct scan was read as showing no acute abdominal process or obstruction, but incidental note was made of:  1. Prominent stool throughout the colon favors constipation. 2. Circumferential distal esophageal wall thickening, technically nonspecific although esophagitis would be a common cause. 3. Systemic athero sclerosis including the abdominal aorta and coronary arteries.  Discuss above findings with primary care doctor at follow up.   If constipated, make sure to stay well hydrated, get adequate fiber in diet, and take colace (stool softener) 2x/day, and miralax  (laxative) once per day as need - these meds are available over the counter.   Follow up closely with primary care doctor in the coming week.   Return to ER right away if worse, new symptoms, high fevers, new or severe or worsening abdominal pain, persistent vomiting, chest pain, trouble breathing, or other emergency concern.

## 2022-02-17 NOTE — ED Notes (Signed)
Pt tolerating crackers and diet sprite.

## 2022-02-17 NOTE — ED Provider Notes (Signed)
[facility] EMERGENCY DEPARTMENT Provider Note   CSN: [id number] Arrival date & time: 02/17/22  1022     History  Chief Complaint  Patient presents with   Nausea   Emesis    Keith Ewing is a 57 y.o. male.  Patient with hx iddm, presents with nausea/vomiting in the past two days. Symptoms acute onset, moderate/severe, persistent, recurrent. Emesis not bloody or bilious. No abd distension or pain.  Having normal bms. No dysuria or gu c/o. No chest pain or discomfort. No sob. No cough or uri symptoms. No headache.  No fever or chills.   The history is provided by the patient, a relative and medical records.  Abdominal Pain Associated symptoms: nausea and vomiting   Associated symptoms: no chest pain, no chills, no dysuria, no fever, no shortness of breath and no sore throat        Home Medications Prior to Admission medications   Medication Sig Start Date End Date Taking? Authorizing Provider  insulin aspart (NOVOLOG) 100 UNIT/ML injection Inject 6 Units into the skin 3 (three) times daily before meals. Pt takes on a sliding scale under 150 none over 151-200 6 units, 201-249 8 units, 250-300 10 units, 301 12 units    [provider]  insulin glargine (LANTUS) 100 UNIT/ML injection Inject 40 Units into the skin 2 (two) times daily.    [provider]  simvastatin (ZOCOR) 40 MG tablet Take 40 mg by mouth daily.    [provider]      Allergies    Patient has no known allergies.    Review of Systems   Review of Systems  Constitutional:  Negative for chills and fever.  HENT:  Negative for sore throat.   Eyes:  Negative for redness.  Respiratory:  Negative for shortness of breath.   Cardiovascular:  Negative for chest pain.  Gastrointestinal:  Positive for abdominal pain, nausea and vomiting.  Genitourinary:  Negative for dysuria and flank pain.  Musculoskeletal:  Negative for back pain and neck pain.  Skin:  Negative for rash.  Neurological:   Negative for headaches.  Hematological:  Does not bruise/bleed easily.  Psychiatric/Behavioral:  Negative for confusion.     Physical Exam Updated Vital Signs BP 120/82   Pulse (!) 118   Temp 99.2 F (37.3 C) (Oral)   Resp 20   Ht 1.702 m (5\' 7" )   Wt 78.5 kg   SpO2 97%   BMI 27.10 kg/m  Physical Exam Vitals and nursing note reviewed.  Constitutional:      Appearance: Normal appearance. He is well-developed.  HENT:     Head: Atraumatic.     Nose: Nose normal.     Mouth/Throat:     Mouth: Mucous membranes are moist.     Pharynx: Oropharynx is clear.  Eyes:     General: No scleral icterus.    Conjunctiva/sclera: Conjunctivae normal.     Pupils: Pupils are equal, round, and reactive to light.  Neck:     Trachea: No tracheal deviation.  Cardiovascular:     Rate and Rhythm: Regular rhythm. Tachycardia present.     Pulses: Normal pulses.     Heart sounds: Normal heart sounds. No murmur heard.    No friction rub. No gallop.  Pulmonary:     Effort: Pulmonary effort is normal. No accessory muscle usage or respiratory distress.     Breath sounds: Normal breath sounds.  Abdominal:     General: Bowel sounds are normal.  There is no distension.     Palpations: Abdomen is soft.     Tenderness: There is no abdominal tenderness. There is no guarding.  Genitourinary:    Comments: No cva tenderness. Musculoskeletal:        General: No swelling.     Cervical back: Normal range of motion and neck supple. No rigidity.  Skin:    General: Skin is warm and dry.     Findings: No rash.  Neurological:     Mental Status: He is alert.     Comments: Alert, speech clear. Motor/sens grossly intact.   Psychiatric:        Mood and Affect: Mood normal.     ED Results / Procedures / Treatments   Labs (all labs ordered are listed, but only abnormal results are displayed) Results for orders placed or performed during the hospital encounter of 02/17/22  Lipase, blood  Result Value Ref Range    Lipase 51 11 - 51 U/L  Comprehensive metabolic panel  Result Value Ref Range   Sodium 138 135 - 145 mmol/L   Potassium 4.5 3.5 - 5.1 mmol/L   Chloride 96 (L) 98 - 111 mmol/L   CO2 30 22 - 32 mmol/L   Glucose, Bld 132 (H) 70 - 99 mg/dL   BUN 23 (H) 6 - 20 mg/dL   Creatinine, Ser 9.24 (H) 0.61 - 1.24 mg/dL   Calcium 9.5 8.9 - 26.8 mg/dL   Total Protein 7.8 6.5 - 8.1 g/dL   Albumin 4.0 3.5 - 5.0 g/dL   AST 42 (H) 15 - 41 U/L   ALT 18 0 - 44 U/L   Alkaline Phosphatase 114 38 - 126 U/L   Total Bilirubin 0.6 0.3 - 1.2 mg/dL   GFR, Estimated 38 (L) >60 mL/min   Anion gap 12 5 - 15  CBC  Result Value Ref Range   WBC 7.4 4.0 - 10.5 K/uL   RBC 5.30 4.22 - 5.81 MIL/uL   Hemoglobin 14.6 13.0 - 17.0 g/dL   HCT 34.1 96.2 - 22.9 %   MCV 88.7 80.0 - 100.0 fL   MCH 27.5 26.0 - 34.0 pg   MCHC 31.1 30.0 - 36.0 g/dL   RDW 79.8 92.1 - 19.4 %   Platelets 342 150 - 400 K/uL   nRBC 0.0 0.0 - 0.2 %  Urinalysis, Routine w reflex microscopic Urine, Clean Catch  Result Value Ref Range   Color, Urine YELLOW YELLOW   APPearance CLEAR CLEAR   Specific Gravity, Urine 1.030 1.005 - 1.030   pH 5.0 5.0 - 8.0   Glucose, UA >=500 (A) NEGATIVE mg/dL   Hgb urine dipstick NEGATIVE NEGATIVE   Bilirubin Urine NEGATIVE NEGATIVE   Ketones, ur 5 (A) NEGATIVE mg/dL   Protein, ur 30 (A) NEGATIVE mg/dL   Nitrite NEGATIVE NEGATIVE   Leukocytes,Ua NEGATIVE NEGATIVE   RBC / HPF 0-5 0 - 5 RBC/hpf   WBC, UA 0-5 0 - 5 WBC/hpf   Bacteria, UA NONE SEEN NONE SEEN   Squamous Epithelial / HPF 0-5 0 - 5 /HPF  Beta-hydroxybutyric acid  Result Value Ref Range   Beta-Hydroxybutyric Acid 0.56 (H) 0.05 - 0.27 mmol/L  CBG monitoring, ED  Result Value Ref Range   Glucose-Capillary 144 (H) 70 - 99 mg/dL    EKG None  Radiology CT ABDOMEN PELVIS WO CONTRAST  Result Date: 02/17/2022 CLINICAL DATA:  Abdominal pain with nausea and vomiting EXAM: CT ABDOMEN AND PELVIS WITHOUT CONTRAST TECHNIQUE: Multidetector CT  imaging of  the abdomen and pelvis was performed following the standard protocol without IV contrast. RADIATION DOSE REDUCTION: This exam was performed according to the departmental dose-optimization program which includes automated exposure control, adjustment of the mA and/or kV according to patient size and/or use of iterative reconstruction technique. COMPARISON:  10/19/2017 FINDINGS: Lower chest: Left anterior descending and right coronary artery atherosclerotic calcification. Circumferential distal esophageal wall thickening as on image 16 series 2, technically nonspecific although esophagitis would be a common cause. Mild scarring anteriorly in the right lower lobe and medially in the left lower lobe. Old granulomatous disease in the left lower lobe. Hepatobiliary: Unremarkable Pancreas: Unremarkable Spleen: Punctate calcifications compatible with old granulomatous disease. Adrenals/Urinary Tract: Low-density fullness of the adrenal glands without a discrete mass. The kidneys appear unremarkable Stomach/Bowel: Appendix surgically absent. Prominent stool throughout the colon favors constipation. No dilated bowel or specific noncontrast CT bowel abnormality observed. Vascular/Lymphatic: Atheromatous vascular calcification in the abdominal aorta, SMA, iliac arteries, and common femoral arteries. No pathologic adenopathy observed. Reproductive: Dense calcification of the vas deferens and medial seminal vesicles. This can be associated with diabetes. Other: Localized subcutaneous edema along the right anterior abdominal wall the level of the iliac crests, possibly injection related. No ascites. Musculoskeletal: Unremarkable IMPRESSION: 1. Prominent stool throughout the colon favors constipation. 2. Circumferential distal esophageal wall thickening, technically nonspecific although esophagitis would be a common cause. 3. Systemic atherosclerosis including the abdominal aorta and coronary arteries. 4. Old granulomatous disease.  5. Dense calcification of the vas deferens zones medial seminal vesicles, which can be associated with diabetes. 6. Localized subcutaneous edema along the right anterior abdominal wall the level of the iliac crests, possibly injection related. Aortic Atherosclerosis (ICD10-I70.0). Electronically Signed   By: Van Clines M.D.   On: 02/17/2022 13:53    Procedures Procedures    Medications Ordered in ED Medications  sodium chloride 0.9 % bolus 1,000 mL (0 mLs Intravenous Stopped 02/17/22 1300)  ondansetron (ZOFRAN) injection 4 mg (4 mg Intravenous Given 02/17/22 1131)  metoCLOPramide (REGLAN) injection 10 mg (10 mg Intravenous Given 02/17/22 1349)  lactated ringers bolus 1,000 mL (1,000 mLs Intravenous New Bag/Given 02/17/22 1348)    ED Course/ Medical Decision Making/ A&P                           Medical Decision Making Problems Addressed: AKI (acute kidney injury) [facility]): acute illness or injury Dehydration: acute illness or injury with systemic symptoms that poses a threat to life or bodily functions 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 Labs: ordered. Decision-making details documented in ED Course. Radiology: ordered and independent interpretation performed. Decision-making details documented in ED Course.  Risk Prescription drug management. Decision regarding hospitalization.  Iv ns. Continuous pulse ox and cardiac monitoring. Labs ordered/sent. Imaging ordered.   Differential diagnosis includes gastroparesis,  gastroenteritis, sbo, aki/dehydration, etc . Dispo decision including potential need for admission considered - will get labs and imaging and reassess.   Reviewed nursing notes and prior charts for additional history. External  reports reviewed. Additional history from: family.  Pt actively vomiting in triage, clear to mildly yellowish.   Ivf bolus. Zofran iv.   Cardiac monitor: sinus rhythm, rate 116.  Labs reviewed/interpreted by me - wbc and hgb normal. Hco3 normal. AG normal.   CT reviewed/interpreted by me - no sbo.  Iv fluids in ED.   Recheck tolerating po.   Pt currently appears stable for d/c.   Return precaution provided.             Final Clinical Impression(s) / ED Diagnoses Final diagnoses:  Nausea and vomiting in adult  Dehydration  Insulin dependent type 2 diabetes mellitus ([place])  AKI (acute kidney injury) ([place])  Stage 3b chronic kidney disease ([place])    Rx / DC Orders ED Discharge Orders     None         Lajean Saver, MD 02/17/22 1408

## 2022-02-18 DIAGNOSIS — R0902 Hypoxemia: Secondary | ICD-10-CM | POA: Diagnosis not present

## 2022-02-18 DIAGNOSIS — I10 Essential (primary) hypertension: Secondary | ICD-10-CM | POA: Diagnosis not present

## 2022-02-18 DIAGNOSIS — R1111 Vomiting without nausea: Secondary | ICD-10-CM | POA: Diagnosis not present

## 2022-02-19 ENCOUNTER — Emergency Department (HOSPITAL_COMMUNITY): Payer: No Typology Code available for payment source

## 2022-02-19 ENCOUNTER — Other Ambulatory Visit: Payer: Self-pay

## 2022-02-19 ENCOUNTER — Encounter (HOSPITAL_COMMUNITY): Payer: Self-pay | Admitting: Emergency Medicine

## 2022-02-19 ENCOUNTER — Emergency Department (HOSPITAL_COMMUNITY)
Admission: EM | Admit: 2022-02-19 | Discharge: 2022-02-19 | Disposition: A | Discharge status: Home / Self Care | Payer: No Typology Code available for payment source | Attending: Emergency Medicine | Admitting: Emergency Medicine

## 2022-02-19 DIAGNOSIS — Z1152 Encounter for screening for COVID-19: Secondary | ICD-10-CM | POA: Diagnosis not present

## 2022-02-19 DIAGNOSIS — Z794 Long term (current) use of insulin: Secondary | ICD-10-CM | POA: Diagnosis not present

## 2022-02-19 DIAGNOSIS — E119 Type 2 diabetes mellitus without complications: Secondary | ICD-10-CM | POA: Diagnosis not present

## 2022-02-19 DIAGNOSIS — R1110 Vomiting, unspecified: Secondary | ICD-10-CM | POA: Diagnosis not present

## 2022-02-19 DIAGNOSIS — R079 Chest pain, unspecified: Secondary | ICD-10-CM | POA: Diagnosis not present

## 2022-02-19 DIAGNOSIS — K209 Esophagitis, unspecified without bleeding: Secondary | ICD-10-CM | POA: Diagnosis not present

## 2022-02-19 DIAGNOSIS — R0602 Shortness of breath: Secondary | ICD-10-CM | POA: Diagnosis present

## 2022-02-19 LAB — CBC WITH DIFFERENTIAL/PLATELET
Abs Immature Granulocytes: 0.02 10*3/uL (ref 0.00–0.07)
Basophils Absolute: 0.1 10*3/uL (ref 0.0–0.1)
Basophils Relative: 1 %
Eosinophils Absolute: 0.1 10*3/uL (ref 0.0–0.5)
Eosinophils Relative: 1 %
HCT: 44.3 % (ref 39.0–52.0)
Hemoglobin: 13.9 g/dL (ref 13.0–17.0)
Immature Granulocytes: 0 %
Lymphocytes Relative: 31 %
Lymphs Abs: 2.6 10*3/uL (ref 0.7–4.0)
MCH: 27.5 pg (ref 26.0–34.0)
MCHC: 31.4 g/dL (ref 30.0–36.0)
MCV: 87.7 fL (ref 80.0–100.0)
Monocytes Absolute: 0.7 10*3/uL (ref 0.1–1.0)
Monocytes Relative: 8 %
Neutro Abs: 5 10*3/uL (ref 1.7–7.7)
Neutrophils Relative %: 59 %
Platelets: 347 10*3/uL (ref 150–400)
RBC: 5.05 MIL/uL (ref 4.22–5.81)
RDW: 12.3 % (ref 11.5–15.5)
WBC: 8.4 10*3/uL (ref 4.0–10.5)
nRBC: 0 % (ref 0.0–0.2)

## 2022-02-19 LAB — D-DIMER, QUANTITATIVE: D-Dimer, Quant: 1.45 ug/mL-FEU — ABNORMAL HIGH (ref 0.00–0.50)

## 2022-02-19 LAB — RESP PANEL BY RT-PCR (RSV, FLU A&B, COVID)  RVPGX2
Influenza A by PCR: NEGATIVE
Influenza B by PCR: NEGATIVE
Resp Syncytial Virus by PCR: NEGATIVE
SARS Coronavirus 2 by RT PCR: NEGATIVE

## 2022-02-19 LAB — BASIC METABOLIC PANEL
Anion gap: 11 (ref 5–15)
BUN: 26 mg/dL — ABNORMAL HIGH (ref 6–20)
CO2: 31 mmol/L (ref 22–32)
Calcium: 9.2 mg/dL (ref 8.9–10.3)
Chloride: 94 mmol/L — ABNORMAL LOW (ref 98–111)
Creatinine, Ser: 1.89 mg/dL — ABNORMAL HIGH (ref 0.61–1.24)
GFR, Estimated: 41 mL/min — ABNORMAL LOW (ref >60)
Glucose, Bld: 134 mg/dL — ABNORMAL HIGH (ref 70–99)
Potassium: 3.8 mmol/L (ref 3.5–5.1)
Sodium: 136 mmol/L (ref 135–145)

## 2022-02-19 LAB — TROPONIN I (HIGH SENSITIVITY)
Troponin I (High Sensitivity): 3 ng/L (ref <18)
Troponin I (High Sensitivity): 3 ng/L (ref <18)

## 2022-02-19 LAB — CBG MONITORING, ED: Glucose-Capillary: 125 mg/dL — ABNORMAL HIGH (ref 70–99)

## 2022-02-19 LAB — BRAIN NATRIURETIC PEPTIDE: B Natriuretic Peptide: 9 pg/mL (ref 0.0–100.0)

## 2022-02-19 MED ORDER — SODIUM CHLORIDE 0.9 % IV BOLUS
1000.0000 mL | Freq: Once | INTRAVENOUS | Status: AC
  Administered 2022-02-19: 1000 mL via INTRAVENOUS

## 2022-02-19 MED ORDER — ONDANSETRON HCL 4 MG/2ML IJ SOLN
4.0000 mg | Freq: Once | INTRAMUSCULAR | Status: AC
  Administered 2022-02-19: 4 mg via INTRAVENOUS
  Filled 2022-02-19: qty 2

## 2022-02-19 MED ORDER — FENTANYL CITRATE PF 50 MCG/ML IJ SOSY
50.0000 ug | PREFILLED_SYRINGE | Freq: Once | INTRAMUSCULAR | Status: AC
  Administered 2022-02-19: 50 ug via INTRAVENOUS
  Filled 2022-02-19: qty 1

## 2022-02-19 MED ORDER — OMEPRAZOLE 20 MG PO CPDR: 20.0000 mg | DELAYED_RELEASE_CAPSULE | Freq: Every day | ORAL | 0 refills | Status: AC

## 2022-02-19 MED ORDER — IOHEXOL 350 MG/ML SOLN
65.0000 mL | Freq: Once | INTRAVENOUS | Status: AC | PRN
  Administered 2022-02-19: 65 mL via INTRAVENOUS

## 2022-02-19 MED ORDER — PANTOPRAZOLE SODIUM 40 MG IV SOLR
40.0000 mg | Freq: Once | INTRAVENOUS | Status: AC
  Administered 2022-02-19: 40 mg via INTRAVENOUS
  Filled 2022-02-19: qty 10

## 2022-02-19 NOTE — Discharge Instructions (Addendum)
Please avoid any alcohol.  Please avoid any ibuprofen or aspirin products. Please call the doctor listed or follow-up with the [facility] for a stomach specialist

## 2022-02-19 NOTE — ED Triage Notes (Signed)
Pt BIB [facility] from home c/o shortness of breath for the past few hours. Pt seen here yesterday for emesis.

## 2022-02-19 NOTE — ED Provider Notes (Signed)
[facility] EMERGENCY DEPARTMENT Provider Note   CSN: [id number] Arrival date & time: 02/19/22  0000     History  Chief Complaint  Patient presents with   Shortness of Breath    Keith Ewing is a 57 y.o. male.  The history is provided by the patient.  Patient with history of diabetes presents with shortness of breath, vomiting and chest pain.  Patient was recently seen in the ER for similar episode, felt improved and was discharged home.  He reports over the past several hours he has been having multiple episodes of nonbloody emesis.  He reports feeling short of breath.  He also reports centralized sharp chest pain that is worse with breathing.  His abdominal pain has improved.     Past Medical History:  Diagnosis Date   Diabetes mellitus without complication ([place])      Home Medications Prior to Admission medications   Medication Sig Start Date End Date Taking? Authorizing Provider  omeprazole (PRILOSEC) 20 MG capsule Take 1 capsule (20 mg total) by mouth daily. 02/19/22  Yes Ripley Fraise, MD  insulin aspart (NOVOLOG) 100 UNIT/ML injection Inject 6 Units into the skin 3 (three) times daily before meals. Pt takes on a sliding scale under 150 none over 151-200 6 units, 201-249 8 units, 250-300 10 units, 301 12 units    [provider]  insulin glargine (LANTUS) 100 UNIT/ML injection Inject 40 Units into the skin 2 (two) times daily.    [provider]  simvastatin (ZOCOR) 40 MG tablet Take 40 mg by mouth daily.    [provider]      Allergies    Patient has no known allergies.    Review of Systems   Review of Systems  Constitutional:  Negative for fever.  Gastrointestinal:  Positive for nausea and vomiting.    Physical Exam Updated Vital Signs BP 124/75   Pulse 69   Temp 98.9 F (37.2 C) (Oral)   Resp 16   Ht 1.702 m (5\' 7" )   Wt 78.5 kg   SpO2 98%   BMI 27.10 kg/m  Physical Exam CONSTITUTIONAL: Well developed/well  nourished HEAD: Normocephalic/atraumatic EYES: EOMI/PERRL ENMT: Mucous membranes moist NECK: supple no meningeal signs CV: S1/S2 noted, no murmurs/rubs/gallops noted LUNGS: Lungs are clear to auscultation bilaterally, no apparent distress ABDOMEN: soft, nontender GU:no cva tenderness NEURO: Pt is awake/alert/appropriate, moves all extremitiesx4.  No facial droop.   EXTREMITIES: pulses normal/equal, full ROM SKIN: warm, color normal PSYCH: no abnormalities of mood noted, alert and oriented to situation  ED Results / Procedures / Treatments   Labs (all labs ordered are listed, but only abnormal results are displayed) Labs Reviewed  BASIC METABOLIC PANEL - Abnormal; Notable for the following components:      Result Value   Chloride 94 (*)    Glucose, Bld 134 (*)    BUN 26 (*)    Creatinine, Ser 1.89 (*)    GFR, Estimated 41 (*)    All other components within normal limits  D-DIMER, QUANTITATIVE - Abnormal; Notable for the following components:   D-Dimer, Quant 1.45 (*)    All other components within normal limits  CBG MONITORING, ED - Abnormal; Notable for the following components:   Glucose-Capillary 125 (*)    All other components within normal limits  RESP PANEL BY RT-PCR (RSV, FLU A&B, COVID)  [id number]  CBC WITH DIFFERENTIAL/PLATELET  BRAIN NATRIURETIC PEPTIDE  TROPONIN I (HIGH SENSITIVITY)  TROPONIN I (HIGH SENSITIVITY)  EKG EKG Interpretation  Date/Time:  Sunday February 19 2022 00:09:48 EST Ventricular Rate:  96 PR Interval:  143 QRS Duration: 89 QT Interval:  360 QTC Calculation: 455 R Axis:   -5 Text Interpretation: Sinus rhythm Minimal ST elevation, anterior leads No significant change since last tracing Confirmed by Zadie Rhine ([id number]) on 02/19/2022 12:13:11 AM  Radiology CT Angio Chest PE W and/or Wo Contrast  Result Date: 02/19/2022 CLINICAL DATA:  Pulmonary embolism suspected, low to intermediate probability. Shortness of breath and positive D-dimer.  EXAM: CT ANGIOGRAPHY CHEST WITH CONTRAST TECHNIQUE: Multidetector CT imaging of the chest was performed using the standard protocol during bolus administration of intravenous contrast. Multiplanar CT image reconstructions and MIPs were obtained to evaluate the vascular anatomy. RADIATION DOSE REDUCTION: This exam was performed according to the departmental dose-optimization program which includes automated exposure control, adjustment of the mA and/or kV according to patient size and/or use of iterative reconstruction technique. CONTRAST:  23mL OMNIPAQUE IOHEXOL 350 MG/ML SOLN COMPARISON:  02/17/2022 FINDINGS: Cardiovascular: The heart is normal in size and there is no pericardial effusion. A few scattered coronary artery calcifications are noted. The aorta and pulmonary trunk are normal in caliber. No pulmonary artery filling defect is identified. Mediastinum/Nodes: No mediastinal, hilar, or axillary lymphadenopathy. Thyroid gland and trachea are within normal limits. There is diffuse thickening of the esophagus. Lungs/Pleura: Mild atelectasis is noted bilaterally. No effusion or pneumothorax Upper Abdomen: Gastric wall thickening is noted. Musculoskeletal: There is a mild compression deformity and Schmorl's node in the superior endplate at T9. Review of the MIP images confirms the above findings. IMPRESSION: 1. No evidence of pulmonary embolism. 2. Diffuse thickening of the esophageal wall and gastric wall thickening, possible gastritis/esophagitis. 3. Coronary artery calcifications. Electronically Signed   By: Thornell Sartorius M.D.   On: 02/19/2022 03:44   DG Chest Port 1 View  Result Date: 02/19/2022 CLINICAL DATA:  Shortness of breath. EXAM: PORTABLE CHEST 1 VIEW COMPARISON:  October 19, 2017 FINDINGS: The heart size and mediastinal contours are within normal limits. Low lung volumes are noted. Mild atelectasis is seen within the bilateral lung bases. There is no evidence of an acute infiltrate, pleural  effusion or pneumothorax. The visualized skeletal structures are unremarkable. IMPRESSION: Low lung volumes with mild bibasilar atelectasis. Electronically Signed   By: Aram Candela M.D.   On: 02/19/2022 00:27   CT ABDOMEN PELVIS WO CONTRAST  Result Date: 02/17/2022 CLINICAL DATA:  Abdominal pain with nausea and vomiting EXAM: CT ABDOMEN AND PELVIS WITHOUT CONTRAST TECHNIQUE: Multidetector CT imaging of the abdomen and pelvis was performed following the standard protocol without IV contrast. RADIATION DOSE REDUCTION: This exam was performed according to the departmental dose-optimization program which includes automated exposure control, adjustment of the mA and/or kV according to patient size and/or use of iterative reconstruction technique. COMPARISON:  10/19/2017 FINDINGS: Lower chest: Left anterior descending and right coronary artery atherosclerotic calcification. Circumferential distal esophageal wall thickening as on image 16 series 2, technically nonspecific although esophagitis would be a common cause. Mild scarring anteriorly in the right lower lobe and medially in the left lower lobe. Old granulomatous disease in the left lower lobe. Hepatobiliary: Unremarkable Pancreas: Unremarkable Spleen: Punctate calcifications compatible with old granulomatous disease. Adrenals/Urinary Tract: Low-density fullness of the adrenal glands without a discrete mass. The kidneys appear unremarkable Stomach/Bowel: Appendix surgically absent. Prominent stool throughout the colon favors constipation. No dilated bowel or specific noncontrast CT bowel abnormality observed. Vascular/Lymphatic: Atheromatous vascular calcification in the abdominal aorta,  SMA, iliac arteries, and common femoral arteries. No pathologic adenopathy observed. Reproductive: Dense calcification of the vas deferens and medial seminal vesicles. This can be associated with diabetes. Other: Localized subcutaneous edema along the right anterior  abdominal wall the level of the iliac crests, possibly injection related. No ascites. Musculoskeletal: Unremarkable IMPRESSION: 1. Prominent stool throughout the colon favors constipation. 2. Circumferential distal esophageal wall thickening, technically nonspecific although esophagitis would be a common cause. 3. Systemic atherosclerosis including the abdominal aorta and coronary arteries. 4. Old granulomatous disease. 5. Dense calcification of the vas deferens zones medial seminal vesicles, which can be associated with diabetes. 6. Localized subcutaneous edema along the right anterior abdominal wall the level of the iliac crests, possibly injection related. Aortic Atherosclerosis (ICD10-I70.0). Electronically Signed   By: Gaylyn Rong M.D.   On: 02/17/2022 13:53    Procedures Procedures    Medications Ordered in ED Medications  ondansetron (ZOFRAN) injection 4 mg (4 mg Intravenous Given 02/19/22 0034)  ondansetron (ZOFRAN) injection 4 mg (4 mg Intravenous Given 02/19/22 0155)  fentaNYL (SUBLIMAZE) injection 50 mcg (50 mcg Intravenous Given 02/19/22 0155)  pantoprazole (PROTONIX) injection 40 mg (40 mg Intravenous Given 02/19/22 0154)  sodium chloride 0.9 % bolus 1,000 mL (0 mLs Intravenous Stopped 02/19/22 0350)  sodium chloride 0.9 % bolus 1,000 mL (0 mLs Intravenous Stopped 02/19/22 0427)  iohexol (OMNIPAQUE) 350 MG/ML injection 65 mL (65 mLs Intravenous Contrast Given 02/19/22 0323)    ED Course/ Medical Decision Making/ A&P Clinical Course as of 02/19/22 0439  Sun Feb 19, 2022  0431 Creatinine(!): 1.89 Renal insufficiency that is chronic [DW]  0438 Patient feeling improved.  He has been monitored for several hours.  He had mention pleuritic pain, therefore we obtained CT chest.  It was negative for acute PE, but does confirm esophagitis which has been seen recently.  He was seen in the ER on January 5 for abdominal pain and vomiting with a negative CT abdomen pelvis at that time.  I have low  suspicion for ACS at this time.  Cardiac workup was been unremarkable.  He is now awake and alert in no acute distress.  He is drinking fluids without difficulty. [DW]  [phone] I discussed the case with his mother who he stays with.  She confirms he has been feeling sick with vomiting since last week.  She reports he is post to follow-up with the [facility].  I informed her that he will likely need follow-up with gastroenterology and will provide information for local care.  Will also place the patient on Prilosec [DW]    Clinical Course User Index [DW] Zadie Rhine, MD                           Medical Decision Making Amount and/or Complexity of Data Reviewed Labs: ordered. Decision-making details documented in ED Course. Radiology: ordered. ECG/medicine tests: ordered.  Risk Prescription drug management.   This patient presents to the ED for concern of chest pain, this involves an extensive number of treatment options, and is a complaint that carries with it a high risk of complications and morbidity.  The differential diagnosis includes but is not limited to acute coronary syndrome, aortic dissection, pulmonary embolism, pericarditis, pneumothorax, pneumonia, myocarditis, pleurisy, esophageal rupture   Comorbidities that complicate the patient evaluation: Patient's presentation is complicated by their history of diabetes  Social Determinants of Health: Patient's impaired access to primary care patient is seen by the  [facility] increases the complexity of managing their presentation  Additional history obtained: Additional history obtained from family  Lab Tests: I Ordered, and personally interpreted labs.  The pertinent results include: Chronic renal insufficiency  Imaging Studies ordered: I ordered imaging studies including CT scan chest and X-ray chest   I independently visualized and interpreted imaging which showed no pneumonia, no PE I agree with the  radiologist interpretation  Cardiac Monitoring: The patient was maintained on a cardiac monitor.  I personally viewed and interpreted the cardiac monitor which showed an underlying rhythm of:  sinus rhythm  Medicines ordered and prescription drug management: I ordered medication including Protonix and Zofran for nausea and pain Reevaluation of the patient after these medicines showed that the patient    improved  Reevaluation: After the interventions noted above, I reevaluated the patient and found that they have :improved  Complexity of problems addressed: Patient's presentation is most consistent with  acute presentation with potential threat to life or bodily function  Disposition: After consideration of the diagnostic results and the patient's response to treatment,  I feel that the patent would benefit from discharge   .           Final Clinical Impression(s) / ED Diagnoses Final diagnoses:  Shortness of breath  Esophagitis    Rx / DC Orders ED Discharge Orders          Ordered    omeprazole (PRILOSEC) 20 MG capsule  Daily        02/19/22 0438              Zadie Rhine, MD 02/19/22 [phone]

## 2022-02-22 ENCOUNTER — Telehealth: Payer: Self-pay

## 2022-02-22 NOTE — Telephone Encounter (Signed)
        Patient  visited [facility] on 02/19/2022  for shortness of breath.   Telephone encounter attempt :  1st  A HIPAA compliant voice message was left requesting a return call.  Instructed patient to call back at [phone].   [place] Resource Care Guide   [email or web address]  ?? [phone]   Website: [email or web address]  [email or web address]

## 2022-03-02 ENCOUNTER — Telehealth: Payer: Self-pay

## 2022-03-02 NOTE — Telephone Encounter (Signed)
        Patient  visited [facility] on 02/19/2022  for shortness of breath.   Telephone encounter attempt :  2nd  A HIPAA compliant voice message was left requesting a return call.  Instructed patient to call back at [phone].   [place] Resource Care Guide   [email or web address]  ?? [phone]   Website: [email or web address]  [email or web address]

## 2022-03-03 ENCOUNTER — Telehealth: Payer: Self-pay

## 2022-03-03 NOTE — Telephone Encounter (Signed)
        Patient  visited [facility] on 02/19/2022  for shortness of breath.   Telephone encounter attempt :  3rd  A HIPAA compliant voice message was left requesting a return call.  Instructed patient to call back at [phone].   [place] Resource Care Guide   [email or web address]  ?? [phone]   Website: [email or web address]  [email or web address]

## 2022-04-21 ENCOUNTER — Inpatient Hospital Stay (HOSPITAL_COMMUNITY)
Admission: EM | Admit: 2022-04-21 | Discharge: 2022-04-24 | DRG: 309 | Disposition: A | Discharge status: Home / Self Care | Payer: No Typology Code available for payment source | Attending: Internal Medicine | Admitting: Internal Medicine

## 2022-04-21 ENCOUNTER — Encounter (HOSPITAL_COMMUNITY): Payer: Self-pay | Admitting: Emergency Medicine

## 2022-04-21 ENCOUNTER — Emergency Department (HOSPITAL_COMMUNITY): Payer: No Typology Code available for payment source

## 2022-04-21 ENCOUNTER — Other Ambulatory Visit: Payer: Self-pay

## 2022-04-21 DIAGNOSIS — K219 Gastro-esophageal reflux disease without esophagitis: Secondary | ICD-10-CM | POA: Insufficient documentation

## 2022-04-21 DIAGNOSIS — I480 Paroxysmal atrial fibrillation: Secondary | ICD-10-CM | POA: Diagnosis not present

## 2022-04-21 DIAGNOSIS — R531 Weakness: Secondary | ICD-10-CM

## 2022-04-21 DIAGNOSIS — Z743 Need for continuous supervision: Secondary | ICD-10-CM | POA: Diagnosis not present

## 2022-04-21 DIAGNOSIS — E1165 Type 2 diabetes mellitus with hyperglycemia: Secondary | ICD-10-CM

## 2022-04-21 DIAGNOSIS — I4891 Unspecified atrial fibrillation: Secondary | ICD-10-CM | POA: Diagnosis not present

## 2022-04-21 DIAGNOSIS — F17210 Nicotine dependence, cigarettes, uncomplicated: Secondary | ICD-10-CM | POA: Diagnosis present

## 2022-04-21 DIAGNOSIS — N179 Acute kidney failure, unspecified: Secondary | ICD-10-CM | POA: Diagnosis not present

## 2022-04-21 DIAGNOSIS — E1122 Type 2 diabetes mellitus with diabetic chronic kidney disease: Secondary | ICD-10-CM | POA: Diagnosis present

## 2022-04-21 DIAGNOSIS — N184 Chronic kidney disease, stage 4 (severe): Secondary | ICD-10-CM | POA: Diagnosis present

## 2022-04-21 DIAGNOSIS — Z794 Long term (current) use of insulin: Secondary | ICD-10-CM

## 2022-04-21 DIAGNOSIS — E782 Mixed hyperlipidemia: Secondary | ICD-10-CM | POA: Insufficient documentation

## 2022-04-21 DIAGNOSIS — R6889 Other general symptoms and signs: Secondary | ICD-10-CM | POA: Diagnosis not present

## 2022-04-21 DIAGNOSIS — R000 Tachycardia, unspecified: Secondary | ICD-10-CM | POA: Diagnosis not present

## 2022-04-21 DIAGNOSIS — Z716 Tobacco abuse counseling: Secondary | ICD-10-CM

## 2022-04-21 DIAGNOSIS — Z79899 Other long term (current) drug therapy: Secondary | ICD-10-CM

## 2022-04-21 DIAGNOSIS — Z720 Tobacco use: Secondary | ICD-10-CM | POA: Insufficient documentation

## 2022-04-21 DIAGNOSIS — N189 Chronic kidney disease, unspecified: Secondary | ICD-10-CM

## 2022-04-21 DIAGNOSIS — I499 Cardiac arrhythmia, unspecified: Secondary | ICD-10-CM | POA: Diagnosis not present

## 2022-04-21 DIAGNOSIS — R739 Hyperglycemia, unspecified: Secondary | ICD-10-CM | POA: Diagnosis not present

## 2022-04-21 LAB — BASIC METABOLIC PANEL
Anion gap: 11 (ref 5–15)
BUN: 39 mg/dL — ABNORMAL HIGH (ref 6–20)
CO2: 33 mmol/L — ABNORMAL HIGH (ref 22–32)
Calcium: 8.6 mg/dL — ABNORMAL LOW (ref 8.9–10.3)
Chloride: 91 mmol/L — ABNORMAL LOW (ref 98–111)
Creatinine, Ser: 2.69 mg/dL — ABNORMAL HIGH (ref 0.61–1.24)
GFR, Estimated: 27 mL/min — ABNORMAL LOW (ref >60)
Glucose, Bld: 352 mg/dL — ABNORMAL HIGH (ref 70–99)
Potassium: 4.1 mmol/L (ref 3.5–5.1)
Sodium: 135 mmol/L (ref 135–145)

## 2022-04-21 LAB — CBC
HCT: 46.7 % (ref 39.0–52.0)
Hemoglobin: 15.1 g/dL (ref 13.0–17.0)
MCH: 27.9 pg (ref 26.0–34.0)
MCHC: 32.3 g/dL (ref 30.0–36.0)
MCV: 86.3 fL (ref 80.0–100.0)
Platelets: 353 10*3/uL (ref 150–400)
RBC: 5.41 MIL/uL (ref 4.22–5.81)
RDW: 12.5 % (ref 11.5–15.5)
WBC: 7.7 10*3/uL (ref 4.0–10.5)
nRBC: 0 % (ref 0.0–0.2)

## 2022-04-21 LAB — CBG MONITORING, ED: Glucose-Capillary: 347 mg/dL — ABNORMAL HIGH (ref 70–99)

## 2022-04-21 LAB — MAGNESIUM: Magnesium: 2.1 mg/dL (ref 1.7–2.4)

## 2022-04-21 LAB — TSH: TSH: 0.694 u[IU]/mL (ref 0.350–4.500)

## 2022-04-21 MED ORDER — HEPARIN BOLUS VIA INFUSION
5000.0000 [IU] | Freq: Once | INTRAVENOUS | Status: AC
  Administered 2022-04-21: 5000 [IU] via INTRAVENOUS

## 2022-04-21 MED ORDER — SODIUM CHLORIDE 0.9 % IV BOLUS
500.0000 mL | Freq: Once | INTRAVENOUS | Status: AC
  Administered 2022-04-21: 500 mL via INTRAVENOUS

## 2022-04-21 MED ORDER — HEPARIN (PORCINE) 25000 UT/250ML-% IV SOLN
900.0000 [IU]/h | INTRAVENOUS | Status: DC
  Administered 2022-04-21: 1100 [IU]/h via INTRAVENOUS
  Filled 2022-04-21: qty 250

## 2022-04-21 MED ORDER — DILTIAZEM HCL-DEXTROSE 125-5 MG/125ML-% IV SOLN (PREMIX)
5.0000 mg/h | INTRAVENOUS | Status: DC
  Administered 2022-04-21: 5 mg/h via INTRAVENOUS
  Administered 2022-04-22: 15 mg/h via INTRAVENOUS
  Administered 2022-04-22: 10 mg/h via INTRAVENOUS
  Filled 2022-04-21 (×3): qty 125

## 2022-04-21 MED ORDER — DILTIAZEM HCL 25 MG/5ML IV SOLN
15.0000 mg | Freq: Once | INTRAVENOUS | Status: AC
  Administered 2022-04-21: 15 mg via INTRAVENOUS
  Filled 2022-04-21: qty 5

## 2022-04-21 NOTE — H&P (Signed)
History and Physical    Patient: EVEN POLIO Ewing:[id number] DOB: [birth year] DOA: 04/21/2022 DOS: the patient was seen and examined on 04/22/2022 PCP: [facility]  Patient coming from: Home  Chief Complaint:  Chief Complaint  Patient presents with   Atrial Fibrillation   HPI: Keith Ewing is a 57 y.o. male with medical history significant of type 2 diabetes mellitus and tobacco abuse who presents to the emergency department due to generalized weakness and feeling nauseous which started today.  He denies shortness of breath, chest pain, fever, chills, abdominal pain.  EMS was activated and patient was taken to the ED for further evaluation and management.  ED Course:  In the emergency department, temperature was 98.6 F, respiratory rate 16/min, pulse was 60 bpm, BP 130/84, O2 sat 95% on room air.  Workup in the ED showed normal CBC, BMP showed sodium 135, potassium 4.1, chloride 91, bicarb 33, blood glucose 352, BUN/creatinine 39/2.69 (baseline creatinine at 1.9-2.0), EGFR 27, magnesium 2.1, TSH 0.694.  MRSA not detected Chest x-ray showed no active disease EKG showed A-fib with RVR.  Patient was treated with IV Cardizem 50 mg x 1 and was started on IV Cardizem drip and heparin drip.  Hospitalist was asked to admit patient for further evaluation and management.  Review of Systems: Review of systems as noted in the HPI. All other systems reviewed and are negative.   Past Medical History:  Diagnosis Date   Diabetes mellitus without complication (HCC)    Past Surgical History:  Procedure Laterality Date   APPENDECTOMY      Social History:  reports that he has been smoking. He uses smokeless tobacco. He reports that he does not drink alcohol. No history on file for drug use.   No Known Allergies  History reviewed. No pertinent family history.   Prior to Admission medications   Medication Sig Start Date End Date Taking? Authorizing Provider  insulin aspart (NOVOLOG) 100  UNIT/ML injection Inject 6 Units into the skin 3 (three) times daily before meals. Pt takes on a sliding scale under 150 none over 151-200 6 units, 201-249 8 units, 250-300 10 units, 301 12 units    [provider]  insulin glargine (LANTUS) 100 UNIT/ML injection Inject 40 Units into the skin 2 (two) times daily.    [provider]  omeprazole (PRILOSEC) 20 MG capsule Take 1 capsule (20 mg total) by mouth daily. 02/19/22   Zadie Rhine, MD  simvastatin (ZOCOR) 40 MG tablet Take 40 mg by mouth daily.    [provider]    Physical Exam: BP (!) 100/56   Pulse 93   Temp 98.6 F (37 C) (Oral)   Resp 14   Ht 5\' 7"  (1.702 m)   Wt 73 kg   SpO2 97%   BMI 25.21 kg/m   General: 57 y.o. year-old male well developed well nourished in no acute distress.  Alert and oriented x3. HEENT: NCAT, EOMI Neck: Supple, trachea medial Cardiovascular: Tachycardia.  Irregularly irregular rate and rhythm with no rubs or gallops.  No thyromegaly or JVD noted.  No lower extremity edema. 2/4 pulses in all 4 extremities. Respiratory: Clear to auscultation with no wheezes or rales. Good inspiratory effort. Abdomen: Soft, nontender nondistended with normal bowel sounds x4 quadrants. Muskuloskeletal: No cyanosis, clubbing or edema noted bilaterally Neuro: CN II-XII intact, strength 5/5 x 4, sensation, reflexes intact Skin: No ulcerative lesions noted or rashes Psychiatry: Judgement and insight appear normal. Mood is appropriate  for condition and setting          Labs on Admission:  Basic Metabolic Panel: Recent Labs  Lab 04/21/22 2050  NA 135  K 4.1  CL 91*  CO2 33*  GLUCOSE 352*  BUN 39*  CREATININE 2.69*  CALCIUM 8.6*  MG 2.1   Liver Function Tests: No results for input(s): "AST", "ALT", "ALKPHOS", "BILITOT", "PROT", "ALBUMIN" in the last 168 hours. No results for input(s): "LIPASE", "AMYLASE" in the last 168 hours. No results for input(s): "AMMONIA" in the last 168  hours. CBC: Recent Labs  Lab 04/21/22 2050  WBC 7.7  HGB 15.1  HCT 46.7  MCV 86.3  PLT 353   Cardiac Enzymes: No results for input(s): "CKTOTAL", "CKMB", "CKMBINDEX", "TROPONINI" in the last 168 hours.  BNP (last 3 results) Recent Labs    02/19/22 0030  BNP 9.0    ProBNP (last 3 results) No results for input(s): "PROBNP" in the last 8760 hours.  CBG: Recent Labs  Lab 04/21/22 2050  GLUCAP 347*    Radiological Exams on Admission: DG Chest 1 View  Result Date: 04/21/2022 CLINICAL DATA:  Weakness EXAM: CHEST  1 VIEW COMPARISON:  02/19/2022 FINDINGS: The heart size and mediastinal contours are within normal limits. Both lungs are clear. The visualized skeletal structures are unremarkable. IMPRESSION: No active disease. Electronically Signed   By: Duanne Guess D.O.   On: 04/21/2022 21:40    EKG: I independently viewed the EKG done and my findings are as followed: A-fib with RVR  Assessment/Plan Present on Admission:  Paroxysmal atrial fibrillation with RVR (HCC)  Acute kidney injury superimposed on chronic kidney disease (HCC)  Principal Problem:   Paroxysmal atrial fibrillation with RVR (HCC) Active Problems:   Acute kidney injury superimposed on chronic kidney disease (HCC)   Generalized weakness   Uncontrolled type 2 diabetes mellitus with hyperglycemia, with long-term current use of insulin (HCC)   GERD (gastroesophageal reflux disease)   Mixed hyperlipidemia   Tobacco abuse  Paroxysmal atrial fibrillation with RVR EKG showed A-fib with RVR CHA2DS2-VASc score is 1 point which is equal to 0.6 % annual risk of stroke Echocardiogram will be done in the morning Continue IV Cardizem drip and heparin drip with plan to transition to oral rate control and DOAC when rate is better controlled. Consider cardiology consult prior to discharge  Generalized weakness in the setting of above Continue management as per above Continue fall precaution  Uncontrolled T2DM  with hyperglycemia Continue Semglee 10 units and adjust dose accordingly Continue ISS and hypoglycemic protocol  Acute kidney injury on CKD stage IV BUN/creatinine 39/2.69 (baseline creatinine at 1.9-2.0) Renally adjust medications, avoid nephrotoxic agents/dehydration/hypotension  GERD Continue Protonix  Mixed hyperlipidemia Continue Zocor  Tobacco abuse Patient was counseled on tobacco abuse cessation   DVT prophylaxis: Heparin drip  Advance Care Planning: CODE STATUS: Full code  Consults: None  Family Communication: None at bedside  Severity of Illness: The appropriate patient status for this patient is OBSERVATION. Observation status is judged to be reasonable and necessary in order to provide the required intensity of service to ensure the patient's safety. The patient's presenting symptoms, physical exam findings, and initial radiographic and laboratory data in the context of their medical condition is felt to place them at decreased risk for further clinical deterioration. Furthermore, it is anticipated that the patient will be medically stable for discharge from the hospital within 2 midnights of admission.   Author: Frankey Shown, DO 04/22/2022 7:28 AM  For  on call review [email or web address].

## 2022-04-21 NOTE — ED Triage Notes (Signed)
Pt BIB [facility] from home, family called due to pt being very weak today. Pt found to be in Afib RVR by EMS, HR 140's-160's, and hypotensive. No hx of same.

## 2022-04-21 NOTE — ED Provider Notes (Signed)
[facility] Provider Note   CSN: [id number] Arrival date & time: 04/21/22  2039     History {Add pertinent medical, surgical, social history, OB history to HPI:1} Chief Complaint  Patient presents with   Atrial Fibrillation    Keith Ewing is a 57 y.o. male.  He has been brought in from home for generalized weakness that started today.  He said he feels nauseous.  He is rather slow to respond, was told by EMS that he has a prior history of stroke.  He denies any fevers chills chest pain shortness of breath palpitations abdominal pain vomiting or diarrhea.  He does endorse tobacco but denies any significant alcohol use or drug use.  The history is provided by the patient.  Weakness Severity:  Moderate Duration:  1 day Timing:  Constant Progression:  Unchanged Chronicity:  New Relieved by:  None tried Worsened by:  Nothing Ineffective treatments:  None tried Associated symptoms: nausea   Associated symptoms: no abdominal pain, no chest pain, no cough, no fever, no headaches, no shortness of breath and no vomiting        Home Medications Prior to Admission medications   Medication Sig Start Date End Date Taking? Authorizing Provider  insulin aspart (NOVOLOG) 100 UNIT/ML injection Inject 6 Units into the skin 3 (three) times daily before meals. Pt takes on a sliding scale under 150 none over 151-200 6 units, 201-249 8 units, 250-300 10 units, 301 12 units    [provider]  insulin glargine (LANTUS) 100 UNIT/ML injection Inject 40 Units into the skin 2 (two) times daily.    [provider]  omeprazole (PRILOSEC) 20 MG capsule Take 1 capsule (20 mg total) by mouth daily. 02/19/22   Ripley Fraise, MD  simvastatin (ZOCOR) 40 MG tablet Take 40 mg by mouth daily.    [provider]      Allergies    Patient has no known allergies.    Review of Systems   Review of Systems  Constitutional:  Negative for  fever.  Respiratory:  Negative for cough and shortness of breath.   Cardiovascular:  Negative for chest pain.  Gastrointestinal:  Positive for nausea. Negative for abdominal pain and vomiting.  Neurological:  Positive for weakness. Negative for headaches.    Physical Exam Updated Vital Signs BP 130/84 (BP Location: Left Arm)   Pulse (!) 162   Temp 98.6 F (37 C) (Oral)   Resp 16   Ht '5\' 7"'$  (1.702 m)   Wt 78.5 kg   SpO2 95%   BMI 27.11 kg/m  Physical Exam Vitals and nursing note reviewed.  Constitutional:      General: He is not in acute distress.    Appearance: Normal appearance. He is well-developed.  HENT:     Head: Normocephalic and atraumatic.  Eyes:     Conjunctiva/sclera: Conjunctivae normal.  Cardiovascular:     Rate and Rhythm: Tachycardia present. Rhythm irregular.     Heart sounds: No murmur heard. Pulmonary:     Effort: Pulmonary effort is normal. No respiratory distress.     Breath sounds: Normal breath sounds.  Abdominal:     Palpations: Abdomen is soft.     Tenderness: There is no abdominal tenderness. There is no guarding or rebound.  Musculoskeletal:        General: No swelling.     Cervical back: Neck supple.  Skin:    General: Skin is warm and dry.  Capillary Refill: Capillary refill takes less than 2 seconds.  Neurological:     General: No focal deficit present.     Mental Status: He is alert.     Motor: No weakness.     Comments: He is awake alert and answers questions appropriately.  He is very slow to answer though.  Moving all extremities without any gross focal deficits.     ED Results / Procedures / Treatments   Labs (all labs ordered are listed, but only abnormal results are displayed) Labs Reviewed  CBG MONITORING, ED - Abnormal; Notable for the following components:      Result Value   Glucose-Capillary 347 (*)    All other components within normal limits    EKG None  Radiology No results found.  Procedures Procedures   {Document cardiac monitor, telemetry assessment procedure when appropriate:1}  Medications Ordered in ED Medications  diltiazem (CARDIZEM) injection 15 mg (has no administration in time range)    ED Course/ Medical Decision Making/ A&P   {   Click here for ABCD2, HEART and other calculatorsREFRESH Note before signing :1}                          Medical Decision Making Amount and/or Complexity of Data Reviewed Labs: ordered. Radiology: ordered.  Risk Prescription drug management.   This patient complains of ***; this involves an extensive number of treatment Options and is a complaint that carries with it a high risk of complications and morbidity. The differential includes ***  I ordered, reviewed and interpreted labs, which included *** I ordered medication *** and reviewed PMP when indicated. I ordered imaging studies which included *** and I independently    visualized and interpreted imaging which showed *** Additional history obtained from *** Previous records obtained and reviewed *** I consulted *** and discussed lab and imaging findings and discussed disposition.  Cardiac monitoring reviewed, *** Social determinants considered, *** Critical Interventions: ***  After the interventions stated above, I reevaluated the patient and found *** Admission and further testing considered, ***   {Document critical care time when appropriate:1} {Document review of labs and clinical decision tools ie heart score, Chads2Vasc2 etc:1}  {Document your independent review of radiology images, and any outside records:1} {Document your discussion with family members, caretakers, and with consultants:1} {Document social determinants of health affecting pt's care:1} {Document your decision making why or why not admission, treatments were needed:1} Final Clinical Impression(s) / ED Diagnoses Final diagnoses:  None    Rx / DC Orders ED Discharge Orders     None

## 2022-04-22 ENCOUNTER — Inpatient Hospital Stay (HOSPITAL_COMMUNITY): Payer: No Typology Code available for payment source

## 2022-04-22 ENCOUNTER — Observation Stay (HOSPITAL_BASED_OUTPATIENT_CLINIC_OR_DEPARTMENT_OTHER): Payer: No Typology Code available for payment source

## 2022-04-22 DIAGNOSIS — K219 Gastro-esophageal reflux disease without esophagitis: Secondary | ICD-10-CM | POA: Insufficient documentation

## 2022-04-22 DIAGNOSIS — N179 Acute kidney failure, unspecified: Secondary | ICD-10-CM | POA: Diagnosis present

## 2022-04-22 DIAGNOSIS — I4891 Unspecified atrial fibrillation: Secondary | ICD-10-CM | POA: Diagnosis present

## 2022-04-22 DIAGNOSIS — E782 Mixed hyperlipidemia: Secondary | ICD-10-CM | POA: Diagnosis present

## 2022-04-22 DIAGNOSIS — Z720 Tobacco use: Secondary | ICD-10-CM | POA: Insufficient documentation

## 2022-04-22 DIAGNOSIS — E1165 Type 2 diabetes mellitus with hyperglycemia: Secondary | ICD-10-CM

## 2022-04-22 DIAGNOSIS — R531 Weakness: Secondary | ICD-10-CM | POA: Diagnosis present

## 2022-04-22 DIAGNOSIS — Z79899 Other long term (current) drug therapy: Secondary | ICD-10-CM | POA: Diagnosis not present

## 2022-04-22 DIAGNOSIS — N184 Chronic kidney disease, stage 4 (severe): Secondary | ICD-10-CM | POA: Diagnosis present

## 2022-04-22 DIAGNOSIS — E1122 Type 2 diabetes mellitus with diabetic chronic kidney disease: Secondary | ICD-10-CM | POA: Diagnosis present

## 2022-04-22 DIAGNOSIS — Z794 Long term (current) use of insulin: Secondary | ICD-10-CM | POA: Diagnosis not present

## 2022-04-22 DIAGNOSIS — I480 Paroxysmal atrial fibrillation: Secondary | ICD-10-CM | POA: Diagnosis not present

## 2022-04-22 DIAGNOSIS — F17210 Nicotine dependence, cigarettes, uncomplicated: Secondary | ICD-10-CM | POA: Diagnosis present

## 2022-04-22 DIAGNOSIS — Z716 Tobacco abuse counseling: Secondary | ICD-10-CM | POA: Diagnosis not present

## 2022-04-22 LAB — GLUCOSE, CAPILLARY
Glucose-Capillary: 379 mg/dL — ABNORMAL HIGH (ref 70–99)
Glucose-Capillary: 430 mg/dL — ABNORMAL HIGH (ref 70–99)
Glucose-Capillary: 282 mg/dL — ABNORMAL HIGH (ref 70–99)

## 2022-04-22 LAB — ECHOCARDIOGRAM COMPLETE
Height: 67 in
S' Lateral: 2.5 cm
Weight: 2574.97 oz

## 2022-04-22 LAB — HEPARIN LEVEL (UNFRACTIONATED): Heparin Unfractionated: 1.08 IU/mL — ABNORMAL HIGH (ref 0.30–0.70)

## 2022-04-22 LAB — HIV ANTIBODY (ROUTINE TESTING W REFLEX): HIV Screen 4th Generation wRfx: NONREACTIVE

## 2022-04-22 LAB — MRSA NEXT GEN BY PCR, NASAL: MRSA by PCR Next Gen: NOT DETECTED

## 2022-04-22 MED ORDER — ATORVASTATIN CALCIUM 20 MG PO TABS
20.0000 mg | ORAL_TABLET | Freq: Every day | ORAL | Status: DC
  Administered 2022-04-22 – 2022-04-24 (×3): 20 mg via ORAL
  Filled 2022-04-22 (×3): qty 1

## 2022-04-22 MED ORDER — INSULIN ASPART 100 UNIT/ML IJ SOLN
0.0000 [IU] | Freq: Three times a day (TID) | INTRAMUSCULAR | Status: DC
  Administered 2022-04-22 (×2): 9 [IU] via SUBCUTANEOUS
  Administered 2022-04-23: 5 [IU] via SUBCUTANEOUS
  Administered 2022-04-23 (×2): 3 [IU] via SUBCUTANEOUS
  Administered 2022-04-24: 2 [IU] via SUBCUTANEOUS

## 2022-04-22 MED ORDER — HEPARIN SODIUM (PORCINE) 5000 UNIT/ML IJ SOLN
5000.0000 [IU] | Freq: Three times a day (TID) | INTRAMUSCULAR | Status: DC
  Administered 2022-04-22 – 2022-04-24 (×6): 5000 [IU] via SUBCUTANEOUS
  Filled 2022-04-22 (×6): qty 1

## 2022-04-22 MED ORDER — ORAL CARE MOUTH RINSE: 15.0000 mL | OROMUCOSAL | Status: DC | PRN

## 2022-04-22 MED ORDER — LACTATED RINGERS IV SOLN: INTRAVENOUS | Status: AC

## 2022-04-22 MED ORDER — SIMVASTATIN 20 MG PO TABS: 40.0000 mg | ORAL_TABLET | Freq: Every day | ORAL | Status: DC

## 2022-04-22 MED ORDER — ASPIRIN 81 MG PO TBEC
81.0000 mg | DELAYED_RELEASE_TABLET | Freq: Every day | ORAL | Status: DC
  Administered 2022-04-22 – 2022-04-24 (×3): 81 mg via ORAL
  Filled 2022-04-22 (×3): qty 1

## 2022-04-22 MED ORDER — INSULIN ASPART 100 UNIT/ML IJ SOLN
0.0000 [IU] | Freq: Every day | INTRAMUSCULAR | Status: DC
  Administered 2022-04-22 – 2022-04-23 (×2): 3 [IU] via SUBCUTANEOUS

## 2022-04-22 MED ORDER — ACETAMINOPHEN 325 MG PO TABS
650.0000 mg | ORAL_TABLET | Freq: Four times a day (QID) | ORAL | Status: DC | PRN
  Administered 2022-04-22: 650 mg via ORAL
  Filled 2022-04-22: qty 2

## 2022-04-22 MED ORDER — CHLORHEXIDINE GLUCONATE CLOTH 2 % EX PADS
6.0000 | MEDICATED_PAD | Freq: Every day | CUTANEOUS | Status: DC
  Administered 2022-04-22 – 2022-04-24 (×3): 6 via TOPICAL

## 2022-04-22 MED ORDER — PANTOPRAZOLE SODIUM 40 MG PO TBEC
40.0000 mg | DELAYED_RELEASE_TABLET | Freq: Every day | ORAL | Status: DC
  Administered 2022-04-22 – 2022-04-24 (×3): 40 mg via ORAL
  Filled 2022-04-22 (×3): qty 1

## 2022-04-22 MED ORDER — INSULIN GLARGINE-YFGN 100 UNIT/ML ~~LOC~~ SOLN
10.0000 [IU] | Freq: Every day | SUBCUTANEOUS | Status: DC
  Administered 2022-04-22 – 2022-04-23 (×2): 10 [IU] via SUBCUTANEOUS
  Filled 2022-04-22 (×3): qty 0.1

## 2022-04-22 NOTE — Progress Notes (Signed)
PROGRESS NOTE    Keith Ewing  [id number] DOB: [birth year] DOA: 04/21/2022 PCP: [facility]   Brief Narrative:    Keith Ewing is a 57 y.o. male with medical history significant of type 2 diabetes mellitus and tobacco abuse who presents to the emergency department due to generalized weakness and feeling nauseous which started on the day of admission.  He was admitted with new onset paroxysmal atrial fibrillation with RVR and was started on IV Cardizem drip.  Assessment & Plan:   Principal Problem:   Paroxysmal atrial fibrillation with RVR (HCC) Active Problems:   Acute kidney injury superimposed on chronic kidney disease (HCC)   Generalized weakness   Uncontrolled type 2 diabetes mellitus with hyperglycemia, with long-term current use of insulin (HCC)   GERD (gastroesophageal reflux disease)   Mixed hyperlipidemia   Tobacco abuse  Assessment and Plan:   New onset paroxysmal atrial fibrillation with RVR EKG showed A-fib with RVR CHA2DS2-VASc score is 1 point which is equal to 0.6 % annual risk of stroke, plan to discontinue heparin drip and start aspirin 81 mg daily Echocardiogram pending Continue IV Cardizem drip and transition to orals once better controlled   Generalized weakness in the setting of above Continue management as per above Continue fall precaution   Uncontrolled T2DM with hyperglycemia Continue Semglee 10 units and adjust dose accordingly Continue ISS and hypoglycemic protocol   Acute kidney injury on CKD stage IV BUN/creatinine 39/2.69 (baseline creatinine at 1.9-2.0) Renally adjust medications, avoid nephrotoxic agents/dehydration/hypotension Gentle, time-limited IV fluid   GERD Continue Protonix   Mixed hyperlipidemia Continue Zocor   Tobacco abuse Patient was counseled on tobacco abuse cessation    DVT prophylaxis: Heparin drip changed to heparin only Code Status: Full Family Communication: None at bedside Disposition Plan:   Status is: Observation The patient will require care spanning > 2 midnights and should be moved to inpatient because: Need for IV medications.   Consultants:  None  Procedures:  None  Antimicrobials:  None   Subjective: Patient seen and evaluated today with no new acute complaints or concerns. No acute concerns or events noted overnight.  Objective: Vitals:   04/22/22 0800 04/22/22 1000 04/22/22 1100 04/22/22 1109  BP: 113/66 (!) 97/59 102/69   Pulse: (!) 122 90 95   Resp: 11 18    Temp:    98.2 F (36.8 C)  TempSrc:    Oral  SpO2: (!) 75% 100% 98%   Weight:      Height:        Intake/Output Summary (Last 24 hours) at 04/22/2022 1150 Last data filed at 04/22/2022 1112 Gross per 24 hour  Intake 1562.31 ml  Output 450 ml  Net 1112.31 ml   Filed Weights   04/21/22 2046 04/22/22 0150  Weight: 78.5 kg 73 kg    Examination:  General exam: Appears calm and comfortable  Respiratory system: Clear to auscultation. Respiratory effort normal.  Nasal cannula Cardiovascular system: S1 & S2 heard, irregular and tachycardic Gastrointestinal system: Abdomen is soft Central nervous system: Alert and awake Extremities: No edema Skin: No significant lesions noted Psychiatry: Flat affect.    Data Reviewed: I have personally reviewed following labs and imaging studies  CBC: Recent Labs  Lab 04/21/22 2050  WBC 7.7  HGB 15.1  HCT 46.7  MCV 86.3  PLT 0000000   Basic Metabolic Panel: Recent Labs  Lab 04/21/22 2050  NA 135  K 4.1  CL 91*  CO2 33*  GLUCOSE  352*  BUN 39*  CREATININE 2.69*  CALCIUM 8.6*  MG 2.1   GFR: Estimated Creatinine Clearance: 28 mL/min (A) (by C-G formula based on SCr of 2.69 mg/dL (H)). Liver Function Tests: No results for input(s): "AST", "ALT", "ALKPHOS", "BILITOT", "PROT", "ALBUMIN" in the last 168 hours. No results for input(s): "LIPASE", "AMYLASE" in the last 168 hours. No results for input(s): "AMMONIA" in the last 168  hours. Coagulation Profile: No results for input(s): "INR", "PROTIME" in the last 168 hours. Cardiac Enzymes: No results for input(s): "CKTOTAL", "CKMB", "CKMBINDEX", "TROPONINI" in the last 168 hours. BNP (last 3 results) No results for input(s): "PROBNP" in the last 8760 hours. HbA1C: No results for input(s): "HGBA1C" in the last 72 hours. CBG: Recent Labs  Lab 04/21/22 2050 04/22/22 1107  GLUCAP 347* 379*   Lipid Profile: No results for input(s): "CHOL", "HDL", "LDLCALC", "TRIG", "CHOLHDL", "LDLDIRECT" in the last 72 hours. Thyroid Function Tests: Recent Labs    04/21/22 2050  TSH 0.694   Anemia Panel: No results for input(s): "VITAMINB12", "FOLATE", "FERRITIN", "TIBC", "IRON", "RETICCTPCT" in the last 72 hours. Sepsis Labs: No results for input(s): "PROCALCITON", "LATICACIDVEN" in the last 168 hours.  Recent Results (from the past 240 hour(s))  MRSA Next Gen by PCR, Nasal     Status: None   Collection Time: 04/22/22  1:43 AM   Specimen: Nasal Mucosa; Nasal Swab  Result Value Ref Range Status   MRSA by PCR Next Gen NOT DETECTED NOT DETECTED Final    Comment: (NOTE) The GeneXpert MRSA Assay (FDA approved for NASAL specimens only), is one component of a comprehensive MRSA colonization surveillance program. It is not intended to diagnose MRSA infection nor to guide or monitor treatment for MRSA infections. Test performance is not FDA approved in patients less than 47 years old. Performed at [facility] : [facility], [address], [place], [place] [address]          Radiology Studies: DG Chest 1 View  Result Date: 04/21/2022 CLINICAL DATA:  Weakness EXAM: CHEST  1 VIEW COMPARISON:  02/19/2022 FINDINGS: The heart size and mediastinal contours are within normal limits. Both lungs are clear. The visualized skeletal structures are unremarkable. IMPRESSION: No active disease. Electronically Signed   By: Davina Poke D.O.   On: 04/21/2022 21:40        Scheduled Meds:   Chlorhexidine Gluconate Cloth  6 each Topical Daily   insulin aspart  0-5 Units Subcutaneous QHS   insulin aspart  0-9 Units Subcutaneous TID WC   insulin glargine-yfgn  10 Units Subcutaneous QHS   Continuous Infusions:  diltiazem (CARDIZEM) infusion 10 mg/hr (04/22/22 0910)   heparin 900 Units/hr (04/22/22 0947)     LOS: 0 days    Time spent: 35 minutes    Jolyssa Oplinger Darleen Crocker, DO [facility]  If 7PM-7AM, please contact night-coverage [email or web address] 04/22/2022, 11:50 AM

## 2022-04-22 NOTE — Progress Notes (Signed)
ANTICOAGULATION CONSULT NOTE -   Pharmacy Consult for Heparin  Indication: atrial fibrillation  No Known Allergies  Patient Measurements: Height: '5\' 7"'$  (170.2 cm) Weight: 73 kg (160 lb 15 oz) IBW/kg (Calculated) : 66.1 Vital Signs: Temp: 98.5 F (36.9 C) (03/09 0730) Temp Source: Oral (03/09 0730) BP: 100/56 (03/09 0600) Pulse Rate: 93 (03/09 0600)  Labs: Recent Labs    04/21/22 2050 04/22/22 0746  HGB 15.1  --   HCT 46.7  --   PLT 353  --   HEPARINUNFRC  --  1.08*  CREATININE 2.69*  --      Estimated Creatinine Clearance: 28 mL/min (A) (by C-G formula based on SCr of 2.69 mg/dL (H)).   Medical History: Past Medical History:  Diagnosis Date   Diabetes mellitus without complication [facility])      Assessment: 57 y/o M with new onset atrial fibrillation.  HL 1.08- supratherapeutic CBC WNL  Goal of Therapy:  Heparin level 0.3-0.7 units/ml Monitor platelets by anticoagulation protocol: Yes   Plan:  Hold heparin infusion for 1 hour Decrease heparin infusion to 900 units/hr Heparin level in 8 hours and daily Continue to monitor H&H and platelets.   Margot Ables, PharmD Clinical Pharmacist 04/22/2022 8:30 AM

## 2022-04-22 NOTE — Progress Notes (Signed)
   04/22/22 2300  What Happened  Was fall witnessed? No  Was patient injured? Yes  Patient found on floor  Found by Staff-comment Birdie Riddle RN and Geanie Kenning RN)  Mitchel Honour RN -- Primary Nurse  Stated prior activity bathroom-unassisted  Provider Notification  Provider Name/Title Adefeso DO  Date Provider Notified 04/22/22  Time Provider Notified 2207  Method of Notification Call  Notification Reason Fall  Provider response See new orders  Date of Provider Response 04/22/22  Time of Provider Response 2208  Follow Up  Family notified No - patient refusal  Additional tests Yes-comment (Head CT WO Contrast)  Adult Fall Risk Assessment  Risk Factor Category (scoring not indicated) Not Applicable  Age 57  Fall History: Fall within 6 months prior to admission 0  Elimination; Bowel and/or Urine Incontinence 0  Elimination; Bowel and/or Urine Urgency/Frequency 2  Medications: includes PCA/Opiates, Anti-convulsants, Anti-hypertensives, Diuretics, Hypnotics, Laxatives, Sedatives, and Psychotropics 3  Patient Care Equipment 2  Mobility-Assistance 2  Mobility-Gait 2  Mobility-Sensory Deficit 0  Altered awareness of immediate physical environment 1  Impulsiveness 2  Lack of understanding of one's physical/cognitive limitations 4  Total Score 18  Patient Fall Risk Level High fall risk  Adult Fall Risk Interventions  Required Bundle Interventions *See Row Information* High fall risk - low, moderate, and high requirements implemented  Additional Interventions Use of appropriate toileting equipment (bedpan, BSC, etc.);HeadStart bed sensor (education/return demonstration)  Screening for Fall Injury Risk (To be completed on HIGH fall risk patients) - Assessing Need for Floor Mats  Risk For Fall Injury- Criteria for Floor Mats Previous fall this admission  Will Implement Floor Mats Yes  Vitals  BP 122/71  MAP (mmHg) 85  Pulse Rate 79  ECG Heart Rate 78  Resp 17  Oxygen Therapy   SpO2 95 %  O2 Device Room Air  Pain Assessment  Pain Scale 0-10  Pain Score 2  Pain Type Acute pain  Pain Location Eye  Pain Orientation Left  Pain Descriptors / Indicators Discomfort  Pain Intervention(s) MD notified (Comment)  Neurological  Neuro (WDL) WDL  Level of Consciousness Alert  Orientation Level Oriented to person;Oriented to place;Disoriented to time;Disoriented to situation  Brewing technologist awareness;Poor judgement;Poor attention/concentration  Speech Clear;Delayed responses  Glasgow Coma Scale  Eye Opening 4  Best Verbal Response (NON-intubated) 4  Best Motor Response 6  Glasgow Coma Scale Score 14  Musculoskeletal  Musculoskeletal (WDL) X  Generalized Weakness Yes  Pain Assessment  Date Pain First Started 04/22/22  Result of Injury Yes  Pain Assessment  Work-Related Injury No

## 2022-04-22 NOTE — Progress Notes (Signed)
ANTICOAGULATION CONSULT NOTE - Initial Consult  Pharmacy Consult for Heparin  Indication: atrial fibrillation  No Known Allergies  Patient Measurements: Height: '5\' 7"'$  (170.2 cm) Weight: 78.5 kg (173 lb 1 oz) IBW/kg (Calculated) : 66.1 Vital Signs: Temp: 98.4 F (36.9 C) (03/08 2300) Temp Source: Oral (03/08 2300) BP: 134/94 (03/08 2300) Pulse Rate: 126 (03/08 2300)  Labs: Recent Labs    04/21/22 2050  HGB 15.1  HCT 46.7  PLT 353  CREATININE 2.69*    Estimated Creatinine Clearance: 28 mL/min (A) (by C-G formula based on SCr of 2.69 mg/dL (H)).   Medical History: Past Medical History:  Diagnosis Date   Diabetes mellitus without complication [facility])      Assessment: 57 y/o M with new onset atrial fibrillation. Starting heparin. CBC good. Scr 2.69. PTA meds reviewed.   Goal of Therapy:  Heparin level 0.3-0.7 units/ml Monitor platelets by anticoagulation protocol: Yes   Plan:  Heparin 5000 units BOLUS Start heparin drip at 1100 units/hr 0800 Heparin level Daily CBC/Heparin level Monitor for bleeding  Narda Bonds, PharmD, BCPS Clinical Pharmacist Phone: [phone]

## 2022-04-22 NOTE — TOC Initial Note (Signed)
Transition of Care [facility]) - Initial/Assessment Note    Patient Details  Name: Keith Ewing MRN: [id number] Date of Birth: Nov 10, 1965  Transition of Care (TOC) CM/SW Contact:    Joaquin Courts, RN Phone Number: 04/22/2022, 11:00 AM  Clinical Narrative:                   Expected Discharge Plan: Home/Self Care    Transition of Care [facility]) Screening Note     Transition of Care Department [facility]) has reviewed patient and no TOC needs have been identified at this time. We will continue to monitor patient advancement through interdisciplinary progression rounds. If new patient transition needs arise, please place a TOC consult.       Activities of Daily Living Home Assistive Devices/Equipment: None ADL Screening (condition at time of admission) Patient's cognitive ability adequate to safely complete daily activities?: Yes Is the patient deaf or have difficulty hearing?: No Does the patient have difficulty seeing, even when wearing glasses/contacts?: No Does the patient have difficulty concentrating, remembering, or making decisions?: No Patient able to express need for assistance with ADLs?: Yes Does the patient have difficulty dressing or bathing?: No Independently performs ADLs?: Yes (appropriate for developmental age) Does the patient have difficulty walking or climbing stairs?: No Weakness of Legs: None Weakness of Arms/Hands: None  Permission Sought/Granted                  Emotional Assessment              Admission diagnosis:  Paroxysmal atrial fibrillation with RVR ([place]) [I48.0] Patient Active Problem List   Diagnosis Date Noted   Generalized weakness 04/22/2022   Uncontrolled type 2 diabetes mellitus with hyperglycemia, with long-term current use of insulin ([place]) 04/22/2022   GERD (gastroesophageal reflux disease) 04/22/2022   Mixed hyperlipidemia 04/22/2022   Tobacco abuse 04/22/2022   Paroxysmal atrial fibrillation with RVR ([place]) 04/21/2022    DKA (diabetic ketoacidoses) 10/19/2017   Acute kidney injury superimposed on chronic kidney disease ([place]) 10/19/2017   Hyponatremia 10/19/2017   Diabetes ([place]) 10/19/2017   PCP:  Clinic, [place]:   [place] [place] - [address] [place] [address] Phone: [phone] Fax: [phone]     Social Determinants of Health (SDOH) Social History: SDOH Screenings   Food Insecurity: No Food Insecurity (04/22/2022)  Housing: Low Risk  (04/22/2022)  Transportation Needs: No Transportation Needs (04/22/2022)  Utilities: Not At Risk (04/22/2022)  Financial Resource Strain: Low Risk  (10/19/2017)  Social Connections: Unknown (10/19/2017)  Stress: No Stress Concern Present (10/19/2017)  Tobacco Use: High Risk (04/21/2022)   SDOH Interventions:     Readmission Risk Interventions     No data to display

## 2022-04-23 DIAGNOSIS — I480 Paroxysmal atrial fibrillation: Secondary | ICD-10-CM | POA: Diagnosis not present

## 2022-04-23 LAB — GLUCOSE, CAPILLARY
Glucose-Capillary: 221 mg/dL — ABNORMAL HIGH (ref 70–99)
Glucose-Capillary: 235 mg/dL — ABNORMAL HIGH (ref 70–99)
Glucose-Capillary: 268 mg/dL — ABNORMAL HIGH (ref 70–99)
Glucose-Capillary: 295 mg/dL — ABNORMAL HIGH (ref 70–99)

## 2022-04-23 LAB — COMPREHENSIVE METABOLIC PANEL
ALT: 13 U/L (ref 0–44)
AST: 29 U/L (ref 15–41)
Albumin: 3.4 g/dL — ABNORMAL LOW (ref 3.5–5.0)
Alkaline Phosphatase: 78 U/L (ref 38–126)
Anion gap: 10 (ref 5–15)
BUN: 53 mg/dL — ABNORMAL HIGH (ref 6–20)
CO2: 31 mmol/L (ref 22–32)
Calcium: 8.2 mg/dL — ABNORMAL LOW (ref 8.9–10.3)
Chloride: 91 mmol/L — ABNORMAL LOW (ref 98–111)
Creatinine, Ser: 2.51 mg/dL — ABNORMAL HIGH (ref 0.61–1.24)
GFR, Estimated: 29 mL/min — ABNORMAL LOW (ref >60)
Glucose, Bld: 216 mg/dL — ABNORMAL HIGH (ref 70–99)
Potassium: 4.2 mmol/L (ref 3.5–5.1)
Sodium: 132 mmol/L — ABNORMAL LOW (ref 135–145)
Total Bilirubin: 0.7 mg/dL (ref 0.3–1.2)
Total Protein: 6.7 g/dL (ref 6.5–8.1)

## 2022-04-23 LAB — CBC
HCT: 41.8 % (ref 39.0–52.0)
Hemoglobin: 13.4 g/dL (ref 13.0–17.0)
MCH: 28 pg (ref 26.0–34.0)
MCHC: 32.1 g/dL (ref 30.0–36.0)
MCV: 87.4 fL (ref 80.0–100.0)
Platelets: 289 10*3/uL (ref 150–400)
RBC: 4.78 MIL/uL (ref 4.22–5.81)
RDW: 12.3 % (ref 11.5–15.5)
WBC: 9.5 10*3/uL (ref 4.0–10.5)
nRBC: 0 % (ref 0.0–0.2)

## 2022-04-23 LAB — PHOSPHORUS: Phosphorus: 4.1 mg/dL (ref 2.5–4.6)

## 2022-04-23 LAB — MAGNESIUM: Magnesium: 2.2 mg/dL (ref 1.7–2.4)

## 2022-04-23 MED ORDER — SODIUM CHLORIDE 0.9 % IV SOLN: INTRAVENOUS | Status: AC

## 2022-04-23 MED ORDER — BACLOFEN 10 MG PO TABS: 5.0000 mg | ORAL_TABLET | Freq: Two times a day (BID) | ORAL | Status: DC | PRN

## 2022-04-23 MED ORDER — BACLOFEN 10 MG PO TABS
10.0000 mg | ORAL_TABLET | Freq: Two times a day (BID) | ORAL | Status: DC | PRN
  Administered 2022-04-23: 10 mg via ORAL
  Filled 2022-04-23: qty 1

## 2022-04-23 MED ORDER — DILTIAZEM HCL 30 MG PO TABS
30.0000 mg | ORAL_TABLET | Freq: Three times a day (TID) | ORAL | Status: DC
  Administered 2022-04-23 – 2022-04-24 (×4): 30 mg via ORAL
  Filled 2022-04-23 (×4): qty 1

## 2022-04-23 NOTE — Progress Notes (Signed)
PROGRESS NOTE    NADER IPPOLITO  [id number] DOB: Jun 19, 1965 DOA: 04/21/2022 PCP: [facility]   Brief Narrative:    Keith Ewing is a 57 y.o. male with medical history significant of type 2 diabetes mellitus and tobacco abuse who presents to the emergency department due to generalized weakness and feeling nauseous which started on the day of admission.  He was admitted with new onset paroxysmal atrial fibrillation with RVR and was started on IV Cardizem drip.  He is now in sinus rhythm on 3/10 and will be started on oral Cardizem.  2D echocardiogram with LVEF 55-60%.  Assessment & Plan:   Principal Problem:   Paroxysmal atrial fibrillation with RVR (HCC) Active Problems:   Acute kidney injury superimposed on chronic kidney disease (HCC)   Generalized weakness   Uncontrolled type 2 diabetes mellitus with hyperglycemia, with long-term current use of insulin (HCC)   GERD (gastroesophageal reflux disease)   Mixed hyperlipidemia   Tobacco abuse   Atrial fibrillation with RVR (HCC)  Assessment and Plan:   New onset paroxysmal atrial fibrillation with RVR-now in sinus rhythm EKG showed A-fib with RVR CHA2DS2-VASc score is 1 point which is equal to 0.6 % annual risk of stroke, plan to discontinue heparin drip and start aspirin 81 mg daily Echocardiogram with LVEF 55-60% Transition to oral Cardizem 3/10 and transfer to telemetry   Generalized weakness in the setting of above Continue management as per above Continue fall precaution Noted to have fall overnight 3/9 and CT head negative for acute findings PT evaluation   Uncontrolled T2DM with hyperglycemia Continue Semglee 10 units and adjust dose accordingly Continue ISS and hypoglycemic protocol   Acute 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 labs   GERD Continue  Protonix   Mixed hyperlipidemia Continue Zocor   Tobacco abuse Patient was counseled on tobacco abuse cessation    DVT prophylaxis: Heparin  Code Status: Full Family Communication: None at bedside Disposition Plan:  Status is: Inpatient Remains inpatient appropriate because: IV medications.    Consultants:  Cardiology  Procedures:  None  Antimicrobials:  None   Subjective: Patient seen and evaluated today with no new acute complaints or concerns.  Heart rate has improved.  Noticed to have a accidental fall overnight with no significant injuries noted.  Objective: Vitals:   04/23/22 0800 04/23/22 0839 04/23/22 0900 04/23/22 1000  BP: 93/64 119/64 109/61 128/68  Pulse: 82 72 70 72  Resp: (!) '9 12 11 17  '$ Temp:      TempSrc:      SpO2: 96% 100% 98% 100%  Weight:      Height:        Intake/Output Summary (Last 24 hours) at 04/23/2022 1036 Last data filed at 04/23/2022 1034 Gross per 24 hour  Intake 1265.76 ml  Output 950 ml  Net 315.76 ml   Filed Weights   04/21/22 2046 04/22/22 0150 04/23/22 0344  Weight: 78.5 kg 73 kg 75 kg    Examination:  General exam: Appears calm and comfortable  Respiratory system: Clear to auscultation. Respiratory effort normal.  Nasal cannula Cardiovascular system: S1 & S2 heard, regular Gastrointestinal system: Abdomen is soft Central nervous system: Alert and awake Extremities: No edema Skin: No significant lesions noted Psychiatry: Flat affect.    Data Reviewed: I have personally reviewed following labs and imaging studies  CBC: Recent Labs  Lab 04/21/22 2050  04/23/22 0400  WBC 7.7 9.5  HGB 15.1 13.4  HCT 46.7 41.8  MCV 86.3 87.4  PLT 353 [id number]   Basic Metabolic Panel: Recent Labs  Lab 04/21/22 2050 04/23/22 0400  NA 135 132*  K 4.1 4.2  CL 91* 91*  CO2 33* 31  GLUCOSE 352* 216*  BUN 39* 53*  CREATININE 2.69* 2.51*  CALCIUM 8.6* 8.2*  MG 2.1 2.2  PHOS  --  4.1   GFR: Estimated Creatinine Clearance:  30 mL/min (A) (by C-G formula based on SCr of 2.51 mg/dL (H)). Liver Function Tests: Recent Labs  Lab 04/23/22 0400  AST 29  ALT 13  ALKPHOS 78  BILITOT 0.7  PROT 6.7  ALBUMIN 3.4*   No results for input(s): "LIPASE", "AMYLASE" in the last 168 hours. No results for input(s): "AMMONIA" in the last 168 hours. Coagulation Profile: No results for input(s): "INR", "PROTIME" in the last 168 hours. Cardiac Enzymes: No results for input(s): "CKTOTAL", "CKMB", "CKMBINDEX", "TROPONINI" in the last 168 hours. BNP (last 3 results) No results for input(s): "PROBNP" in the last 8760 hours. HbA1C: No results for input(s): "HGBA1C" in the last 72 hours. CBG: Recent Labs  Lab 04/21/22 2050 04/22/22 1107 04/22/22 1807 04/22/22 2131 04/23/22 0731  GLUCAP 347* 379* 430* 282* 221*   Lipid Profile: No results for input(s): "CHOL", "HDL", "LDLCALC", "TRIG", "CHOLHDL", "LDLDIRECT" in the last 72 hours. Thyroid Function Tests: Recent Labs    04/21/22 2050  TSH 0.694   Anemia Panel: No results for input(s): "VITAMINB12", "FOLATE", "FERRITIN", "TIBC", "IRON", "RETICCTPCT" in the last 72 hours. Sepsis Labs: No results for input(s): "PROCALCITON", "LATICACIDVEN" in the last 168 hours.  Recent Results (from the past 240 hour(s))  MRSA Next Gen by PCR, Nasal     Status: None   Collection Time: 04/22/22  1:43 AM   Specimen: Nasal Mucosa; Nasal Swab  Result Value Ref Range Status   MRSA by PCR Next Gen NOT DETECTED NOT DETECTED Final    Comment: (NOTE) The GeneXpert MRSA Assay (FDA approved for NASAL specimens only), is one component of a comprehensive MRSA colonization surveillance program. It is not intended to diagnose MRSA infection nor to guide or monitor treatment for MRSA infections. Test performance is not FDA approved in patients less than 109 years old. Performed at [facility], [address], [place], [place] [address]          Radiology Studies: CT HEAD WO CONTRAST  (5MM)  Result Date: 04/22/2022 CLINICAL DATA:  Fall EXAM: CT HEAD WITHOUT CONTRAST TECHNIQUE: Contiguous axial images were obtained from the base of the skull through the vertex without intravenous contrast. RADIATION DOSE REDUCTION: This exam was performed according to the departmental dose-optimization program which includes automated exposure control, adjustment of the mA and/or kV according to patient size and/or use of iterative reconstruction technique. COMPARISON:  01/05/2009 FINDINGS: Brain: No evidence of acute infarction, hemorrhage, mass, mass effect, or midline shift. No hydrocephalus or extra-axial fluid collection. Vascular: No hyperdense vessel. Skull: Negative for fracture or focal lesion. Sinuses/Orbits: Small mucous retention cyst and mild mucosal thickening in the left ethmoid air cells. Otherwise clear paranasal sinuses. No acute finding in the orbits. Other: The mastoid air cells are well aerated. IMPRESSION: No acute intracranial process. Electronically Signed   By: Merilyn Baba M.D.   On: 04/22/2022 23:37   ECHOCARDIOGRAM COMPLETE  Result Date: 04/22/2022    ECHOCARDIOGRAM REPORT   Patient Name:   DESTRY DAWN Date of Exam: 04/22/2022  Medical Rec #:  [id number]     Height:       67.0 in Accession #:    [id number]    Weight:       160.9 lb Date of Birth:  04-Sep-1965     BSA:          1.844 m Patient Age:    25 years      BP:           95/63 mmHg Patient Gender: M             HR:           79 bpm. Exam Location:  [facility] Procedure: 2D Echo, Color Doppler and Cardiac Doppler Indications:    I48.91* Unspecified atrial fibrillation  History:        Patient has no prior history of Echocardiogram examinations.                 Arrythmias:Atrial Fibrillation; Risk Factors:Diabetes and                 Dyslipidemia.  Sonographer:    Raquel Sarna Senior RDCS Referring Phys: [id number] OLADAPO ADEFESO IMPRESSIONS  1. Left ventricular ejection fraction, by estimation, is 55 to 60%. The left ventricle has normal  function. The left ventricle has no regional wall motion abnormalities. Left ventricular diastolic function could not be evaluated.  2. Right ventricular systolic function is normal. The right ventricular size is normal.  3. The mitral valve was not well visualized. No evidence of mitral valve regurgitation.  4. The aortic valve was not well visualized. Aortic valve regurgitation is not visualized.  5. The inferior vena cava is normal in size with greater than 50% respiratory variability, suggesting right atrial pressure of 3 mmHg. Comparison(s): No prior Echocardiogram. FINDINGS  Left Ventricle: Left ventricular ejection fraction, by estimation, is 55 to 60%. The left ventricle has normal function. The left ventricle has no regional wall motion abnormalities. The left ventricular internal cavity size was normal in size. There is  no left ventricular hypertrophy. Left ventricular diastolic function could not be evaluated due to atrial fibrillation. Left ventricular diastolic function could not be evaluated. Right Ventricle: The right ventricular size is normal. No increase in right ventricular wall thickness. Right ventricular systolic function is normal. Left Atrium: Left atrial size was normal in size. Right Atrium: Right atrial size was normal in size. Pericardium: Trivial pericardial effusion is present. Mitral Valve: The mitral valve was not well visualized. No evidence of mitral valve regurgitation. Tricuspid Valve: The tricuspid valve is normal in structure. Tricuspid valve regurgitation is mild . No evidence of tricuspid stenosis. Aortic Valve: The aortic valve was not well visualized. Aortic valve regurgitation is not visualized. Pulmonic Valve: The pulmonic valve was not well visualized. Pulmonic valve regurgitation is not visualized. No evidence of pulmonic stenosis. Aorta: The aortic root, ascending aorta, aortic arch and descending aorta are all structurally normal, with no evidence of dilitation or  obstruction. Venous: The inferior vena cava is normal in size with greater than 50% respiratory variability, suggesting right atrial pressure of 3 mmHg. IAS/Shunts: No atrial level shunt detected by color flow Doppler.  LEFT VENTRICLE PLAX 2D LVIDd:         4.20 cm LVIDs:         2.50 cm LV PW:         0.90 cm LV IVS:        0.90 cm LVOT diam:     2.00  cm LV SV:         42 LV SV Index:   23 LVOT Area:     3.14 cm  RIGHT VENTRICLE RV S prime:     13.70 cm/s TAPSE (M-mode): 1.8 cm LEFT ATRIUM             Index        RIGHT ATRIUM           Index LA diam:        3.00 cm 1.63 cm/m   RA Area:     13.40 cm LA Vol (A2C):   26.0 ml 14.10 ml/m  RA Volume:   30.80 ml  16.70 ml/m LA Vol (A4C):   34.0 ml 18.44 ml/m LA Biplane Vol: 31.4 ml 17.03 ml/m  AORTIC VALVE LVOT Vmax:   83.55 cm/s LVOT Vmean:  61.500 cm/s LVOT VTI:    0.132 m  AORTA Ao Root diam: 3.10 cm Ao Asc diam:  3.00 cm  SHUNTS Systemic VTI:  0.13 m Systemic Diam: 2.00 cm Rudean Haskell MD Electronically signed by Rudean Haskell MD Signature Date/Time: 04/22/2022/1:55:50 PM    Final    DG Chest 1 View  Result Date: 04/21/2022 CLINICAL DATA:  Weakness EXAM: CHEST  1 VIEW COMPARISON:  02/19/2022 FINDINGS: The heart size and mediastinal contours are within normal limits. Both lungs are clear. The visualized skeletal structures are unremarkable. IMPRESSION: No active disease. Electronically Signed   By: Davina Poke D.O.   On: 04/21/2022 21:40        Scheduled Meds:  aspirin EC  81 mg Oral Daily   atorvastatin  20 mg Oral Daily   Chlorhexidine Gluconate Cloth  6 each Topical Daily   diltiazem  30 mg Oral Q8H   heparin injection (subcutaneous)  5,000 Units Subcutaneous Q8H   insulin aspart  0-5 Units Subcutaneous QHS   insulin aspart  0-9 Units Subcutaneous TID WC   insulin glargine-yfgn  10 Units Subcutaneous QHS   pantoprazole  40 mg Oral Daily   Continuous Infusions:  sodium chloride 100 mL/hr at 04/23/22 1034     LOS: 1  day    Time spent: 35 minutes    Mitchael Luckey Darleen Crocker, DO [facility]  If 7PM-7AM, please contact night-coverage [email or web address] 04/23/2022, 10:36 AM

## 2022-04-23 NOTE — TOC Progression Note (Signed)
Transition of Care [facility]) - Progression Note    Patient Details  Name: Keith Ewing MRN: [id number] Date of Birth: Jan 25, 1966  Transition of Care [facility] CM/SW [place], LCSW Phone Number: 04/23/2022, 11:13 AM  Clinical Narrative:     [facility] Notification Completed [phone]  Expected Discharge Plan: Home/Self Care    Expected Discharge Plan and Services                                               Social Determinants of Health (SDOH) Interventions SDOH Screenings   Food Insecurity: No Food Insecurity (04/22/2022)  Housing: Low Risk  (04/22/2022)  Transportation Needs: No Transportation Needs (04/22/2022)  Utilities: Not At Risk (04/22/2022)  Financial Resource Strain: Low Risk  (10/19/2017)  Social Connections: Unknown (10/19/2017)  Stress: No Stress Concern Present (10/19/2017)  Tobacco Use: High Risk (04/21/2022)    Readmission Risk Interventions     No data to display

## 2022-04-23 NOTE — Progress Notes (Addendum)
Estill Bamberg- Mother of the patient  visited and received an update on his fall. The patient's mother claims that following his stroke at the [facility] hospital, he has experienced "some sort of confusion at home.Sometimes I noticed when his sugar drops he be rolling around on the floor at home. I put his bed on the floor so if he rolls of the bed he wont hurt himself". Will cont to monitor and endorse.

## 2022-04-23 NOTE — Progress Notes (Signed)
Patient had an unassisted fall while the primary RN was admitting another patient, patient was drowsy and had a flat affect when found, accompanied to bed with other RN and charge nurse, did neuro checks, and  assessment, MD made aware by the charge nurse, taken to CT and Ct- head came negative, patient slept well all night, Alert and oriented *3 , follows commands, and answering all the questions, high fall risk precautions applied, bed alarm on, and continue close monitoring the patient, will endorse to the oncoming nurse.

## 2022-04-24 DIAGNOSIS — I480 Paroxysmal atrial fibrillation: Secondary | ICD-10-CM | POA: Diagnosis not present

## 2022-04-24 LAB — BASIC METABOLIC PANEL
Anion gap: 10 (ref 5–15)
BUN: 42 mg/dL — ABNORMAL HIGH (ref 6–20)
CO2: 28 mmol/L (ref 22–32)
Calcium: 8.3 mg/dL — ABNORMAL LOW (ref 8.9–10.3)
Chloride: 96 mmol/L — ABNORMAL LOW (ref 98–111)
Creatinine, Ser: 2.02 mg/dL — ABNORMAL HIGH (ref 0.61–1.24)
GFR, Estimated: 38 mL/min — ABNORMAL LOW (ref >60)
Glucose, Bld: 177 mg/dL — ABNORMAL HIGH (ref 70–99)
Potassium: 3.7 mmol/L (ref 3.5–5.1)
Sodium: 134 mmol/L — ABNORMAL LOW (ref 135–145)

## 2022-04-24 LAB — HEMOGLOBIN A1C
Hgb A1c MFr Bld: 8.4 % — ABNORMAL HIGH (ref 4.8–5.6)
Mean Plasma Glucose: 194 mg/dL

## 2022-04-24 LAB — GLUCOSE, CAPILLARY: Glucose-Capillary: 155 mg/dL — ABNORMAL HIGH (ref 70–99)

## 2022-04-24 LAB — MAGNESIUM: Magnesium: 2.1 mg/dL (ref 1.7–2.4)

## 2022-04-24 MED ORDER — ASPIRIN 81 MG PO TBEC: 81.0000 mg | DELAYED_RELEASE_TABLET | Freq: Every day | ORAL | 12 refills | Status: DC

## 2022-04-24 MED ORDER — DILTIAZEM HCL ER COATED BEADS 120 MG PO CP24: 120.0000 mg | ORAL_CAPSULE | Freq: Every day | ORAL | 1 refills | Status: DC

## 2022-04-24 NOTE — Discharge Summary (Signed)
Physician Discharge Summary  Keith Ewing [id number] DOB: 1965/03/15 DOA: 04/21/2022  PCP: [facility]  Admit date: 04/21/2022  Discharge date: 04/24/2022  Admitted From:Home  Disposition:  Home  Recommendations for Outpatient Follow-up:  Follow up with PCP in 1-2 weeks Follow-up with cardiology as scheduled on 3/20 at 1 PM Continue on Cardizem CD 120 mg daily as prescribed for PAF Continue aspirin 81 mg daily Continue other home medications as prior and discontinue amlodipine  Home Health: None  Equipment/Devices: None  Discharge Condition:Stable  CODE STATUS: Full  Diet recommendation: Heart Healthy/carb modified  Brief/Interim Summary:  Keith Ewing is a 57 y.o. male with medical history significant of type 2 diabetes mellitus and tobacco abuse who presents to the emergency department due to generalized weakness and feeling nauseous which started on the day of admission.  He was admitted with new onset paroxysmal atrial fibrillation with RVR and was started on IV Cardizem drip.  He was also noted to have AKI on CKD stage IV.  He is now in sinus rhythm on 3/10 and will be started on oral Cardizem.  2D echocardiogram with LVEF 55-60%.  Heart rate remains in sinus rhythm and is rate controlled.  He denies any further symptomatology and has been seen by PT with no home recommendations noted.  Creatinine levels have returned to baseline with use of IV fluid.  He is in stable condition for discharge and is to remain on medications as noted above.  Discharge Diagnoses:  Principal Problem:   Paroxysmal atrial fibrillation with RVR (HCC) Active Problems:   Acute kidney injury superimposed on chronic kidney disease (HCC)   Generalized weakness   Uncontrolled type 2 diabetes mellitus with hyperglycemia, with long-term current use of insulin (HCC)   GERD (gastroesophageal reflux disease)   Mixed hyperlipidemia   Tobacco abuse   Atrial fibrillation with RVR  ([place])  Principal discharge diagnosis: New onset paroxysmal atrial fibrillation with RVR with AKI on CKD stage IV-resolved.  Discharge Instructions  Discharge Instructions     Diet - low sodium heart healthy   Complete by: As directed    Increase activity slowly   Complete by: As directed       Allergies as of 04/24/2022   No Known Allergies      Medication List     STOP taking these medications    amLODipine 2.5 MG tablet Commonly known as: NORVASC       TAKE these medications    aspirin EC 81 MG tablet Take 1 tablet (81 mg total) by mouth daily. Swallow whole. Start taking on: April 25, 2022   diltiazem 120 MG 24 hr capsule Commonly known as: Cardizem CD Take 1 capsule (120 mg total) by mouth daily.   empagliflozin 25 MG Tabs tablet Commonly known as: JARDIANCE Take 25 mg by mouth daily.   Glucose 15 GM/32ML Gel TAKE 1 TUBE BY MOUTH AS DIRECTED BY YOUR PROVIDER FOR LOW BLOOD SUGAR   insulin aspart 100 UNIT/ML injection Commonly known as: novoLOG Inject 6 Units into the skin 3 (three) times daily before meals. Pt takes on a sliding scale under 150 none over 151-200 6 units, 201-249 8 units, 250-300 10 units, 301 12 units   insulin glargine 100 UNIT/ML injection Commonly known as: LANTUS Inject 40 Units into the skin 2 (two) times daily.   insulin glargine-yfgn 100 UNIT/ML injection Commonly known as: SEMGLEE Inject 28 Units into the skin 2 (two) times daily.   lisinopril 20 MG  tablet Commonly known as: ZESTRIL Take 20 mg by mouth daily.   omeprazole 20 MG capsule Commonly known as: PRILOSEC Take 1 capsule (20 mg total) by mouth daily.   perphenazine 2 MG tablet Commonly known as: TRILAFON Take 1 tablet by mouth 2 (two) times daily.   Semaglutide(0.25 or 0.'5MG'$ /DOS) 2 MG/3ML Sopn INJECT 0.'25MG'$  SUBCUTANEOUSLY ONCE WEEKLY FOR 4 WEEKS, THEN INJECT 0.'5MG'$  ONCE WEEKLY FOR DIABETES   simvastatin 40 MG tablet Commonly known as: ZOCOR Take 40 mg by  mouth daily.        Follow-up Information     Clinic, [facility]. Schedule an appointment as soon as possible for a visit in 1 week(s).   Contact information: [place] [address] [phone]         [facility]. Go to.   Specialty: Cardiology Contact information: [address] [place] [phone]               No Known Allergies  Consultations: None   Procedures/Studies: CT HEAD WO CONTRAST (5MM)  Result Date: 04/22/2022 CLINICAL DATA:  Fall EXAM: CT HEAD WITHOUT CONTRAST TECHNIQUE: Contiguous axial images were obtained from the base of the skull through the vertex without intravenous contrast. RADIATION DOSE REDUCTION: This exam was performed according to the departmental dose-optimization program which includes automated exposure control, adjustment of the mA and/or kV according to patient size and/or use of iterative reconstruction technique. COMPARISON:  01/05/2009 FINDINGS: Brain: No evidence of acute infarction, hemorrhage, mass, mass effect, or midline shift. No hydrocephalus or extra-axial fluid collection. Vascular: No hyperdense vessel. Skull: Negative for fracture or focal lesion. Sinuses/Orbits: Small mucous retention cyst and mild mucosal thickening in the left ethmoid air cells. Otherwise clear paranasal sinuses. No acute finding in the orbits. Other: The mastoid air cells are well aerated. IMPRESSION: No acute intracranial process. Electronically Signed   By: Merilyn Baba M.D.   On: 04/22/2022 23:37   ECHOCARDIOGRAM COMPLETE  Result Date: 04/22/2022    ECHOCARDIOGRAM REPORT   Patient Name:   Keith Ewing Date of Exam: 04/22/2022 Medical Rec #:  [id number]     Height:       67.0 in Accession #:    [id number]    Weight:       160.9 lb Date of Birth:  Aug 16, 1965     BSA:          1.844 m Patient Age:    54 years      BP:           95/63 mmHg Patient Gender: M             HR:            79 bpm. Exam Location:  [facility] Procedure: 2D Echo, Color Doppler and Cardiac Doppler Indications:    I48.91* Unspecified atrial fibrillation  History:        Patient has no prior history of Echocardiogram examinations.                 Arrythmias:Atrial Fibrillation; Risk Factors:Diabetes and                 Dyslipidemia.  Sonographer:    Raquel Sarna Senior RDCS Referring Phys: [id number] OLADAPO ADEFESO IMPRESSIONS  1. Left ventricular ejection fraction, by estimation, is 55 to 60%. The left ventricle has normal function. The left ventricle has no regional wall motion abnormalities. Left ventricular diastolic function could not be evaluated.  2. Right ventricular systolic function is normal. The right ventricular size is normal.  3. The mitral valve was not well visualized. No evidence of mitral valve regurgitation.  4. The aortic valve was not well visualized. Aortic valve regurgitation is not visualized.  5. The inferior vena cava is normal in size with greater than 50% respiratory variability, suggesting right atrial pressure of 3 mmHg. Comparison(s): No prior Echocardiogram. FINDINGS  Left Ventricle: Left ventricular ejection fraction, by estimation, is 55 to 60%. The left ventricle has normal function. The left ventricle has no regional wall motion abnormalities. The left ventricular internal cavity size was normal in size. There is  no left ventricular hypertrophy. Left ventricular diastolic function could not be evaluated due to atrial fibrillation. Left ventricular diastolic function could not be evaluated. Right Ventricle: The right ventricular size is normal. No increase in right ventricular wall thickness. Right ventricular systolic function is normal. Left Atrium: Left atrial size was normal in size. Right Atrium: Right atrial size was normal in size. Pericardium: Trivial pericardial effusion is present. Mitral Valve: The mitral valve was not well visualized. No evidence of mitral valve regurgitation.  Tricuspid Valve: The tricuspid valve is normal in structure. Tricuspid valve regurgitation is mild . No evidence of tricuspid stenosis. Aortic Valve: The aortic valve was not well visualized. Aortic valve regurgitation is not visualized. Pulmonic Valve: The pulmonic valve was not well visualized. Pulmonic valve regurgitation is not visualized. No evidence of pulmonic stenosis. Aorta: The aortic root, ascending aorta, aortic arch and descending aorta are all structurally normal, with no evidence of dilitation or obstruction. Venous: The inferior vena cava is normal in size with greater than 50% respiratory variability, suggesting right atrial pressure of 3 mmHg. IAS/Shunts: No atrial level shunt detected by color flow Doppler.  LEFT VENTRICLE PLAX 2D LVIDd:         4.20 cm LVIDs:         2.50 cm LV PW:         0.90 cm LV IVS:        0.90 cm LVOT diam:     2.00 cm LV SV:         42 LV SV Index:   23 LVOT Area:     3.14 cm  RIGHT VENTRICLE RV S prime:     13.70 cm/s TAPSE (M-mode): 1.8 cm LEFT ATRIUM             Index        RIGHT ATRIUM           Index LA diam:        3.00 cm 1.63 cm/m   RA Area:     13.40 cm LA Vol (A2C):   26.0 ml 14.10 ml/m  RA Volume:   30.80 ml  16.70 ml/m LA Vol (A4C):   34.0 ml 18.44 ml/m LA Biplane Vol: 31.4 ml 17.03 ml/m  AORTIC VALVE LVOT Vmax:   83.55 cm/s LVOT Vmean:  61.500 cm/s LVOT VTI:    0.132 m  AORTA Ao Root diam: 3.10 cm Ao Asc diam:  3.00 cm  SHUNTS Systemic VTI:  0.13 m Systemic Diam: 2.00 cm Rudean Haskell MD Electronically signed by Rudean Haskell MD Signature Date/Time: 04/22/2022/1:55:50 PM    Final    DG Chest 1 View  Result Date: 04/21/2022 CLINICAL DATA:  Weakness EXAM: CHEST  1 VIEW COMPARISON:  02/19/2022 FINDINGS: The heart size and mediastinal contours are within normal limits. Both lungs are clear. The visualized  skeletal structures are unremarkable. IMPRESSION: No active disease. Electronically Signed   By: Davina Poke D.O.   On: 04/21/2022  21:40     Discharge Exam: Vitals:   04/24/22 0031 04/24/22 0507  BP: (!) 139/90 129/83  Pulse: 66 72  Resp: 15 15  Temp: 98.2 F (36.8 C) 98.1 F (36.7 C)  SpO2: 100% 100%   Vitals:   04/23/22 1747 04/23/22 1927 04/24/22 0031 04/24/22 0507  BP: 119/69 113/74 (!) 139/90 129/83  Pulse: 75 61 66 72  Resp: '17 18 15 15  '$ Temp: 98.9 F (37.2 C) 99.6 F (37.6 C) 98.2 F (36.8 C) 98.1 F (36.7 C)  TempSrc: Oral Oral Oral Oral  SpO2: 98% 100% 100% 100%  Weight:      Height:        General: Pt is alert, awake, not in acute distress Cardiovascular: RRR, S1/S2 +, no rubs, no gallops Respiratory: CTA bilaterally, no wheezing, no rhonchi Abdominal: Soft, NT, ND, bowel sounds + Extremities: no edema, no cyanosis    The results of significant diagnostics from this hospitalization (including imaging, microbiology, ancillary and laboratory) are listed below for reference.     Microbiology: Recent Results (from the past 240 hour(s))  MRSA Next Gen by PCR, Nasal     Status: None   Collection Time: 04/22/22  1:43 AM   Specimen: Nasal Mucosa; Nasal Swab  Result Value Ref Range Status   MRSA by PCR Next Gen NOT DETECTED NOT DETECTED Final    Comment: (NOTE) The GeneXpert MRSA Assay (FDA approved for NASAL specimens only), is one component of a comprehensive MRSA colonization surveillance program. It is not intended to diagnose MRSA infection nor to guide or monitor treatment for MRSA infections. Test performance is not FDA approved in patients less than 31 years old. Performed at [facility], [address] [place], [place] [address]      Labs: BNP (last 3 results) Recent Labs    02/19/22 0030  BNP 9.0   Basic Metabolic Panel: Recent Labs  Lab 04/21/22 2050 04/23/22 0400 04/24/22 0357  NA 135 132* 134*  K 4.1 4.2 3.7  CL 91* 91* 96*  CO2 33* 31 28  GLUCOSE 352* 216* 177*  BUN 39* 53* 42*  CREATININE 2.69* 2.51* 2.02*  CALCIUM 8.6* 8.2* 8.3*  MG 2.1 2.2 2.1   PHOS  --  4.1  --    Liver Function Tests: Recent Labs  Lab 04/23/22 0400  AST 29  ALT 13  ALKPHOS 78  BILITOT 0.7  PROT 6.7  ALBUMIN 3.4*   No results for input(s): "LIPASE", "AMYLASE" in the last 168 hours. No results for input(s): "AMMONIA" in the last 168 hours. CBC: Recent Labs  Lab 04/21/22 2050 04/23/22 0400  WBC 7.7 9.5  HGB 15.1 13.4  HCT 46.7 41.8  MCV 86.3 87.4  PLT 353 289   Cardiac Enzymes: No results for input(s): "CKTOTAL", "CKMB", "CKMBINDEX", "TROPONINI" in the last 168 hours. BNP: Invalid input(s): "POCBNP" CBG: Recent Labs  Lab 04/23/22 0731 04/23/22 1147 04/23/22 1559 04/23/22 2022 04/24/22 0725  GLUCAP 221* 235* 268* 295* 155*   D-Dimer No results for input(s): "DDIMER" in the last 72 hours. Hgb A1c No results for input(s): "HGBA1C" in the last 72 hours. Lipid Profile No results for input(s): "CHOL", "HDL", "LDLCALC", "TRIG", "CHOLHDL", "LDLDIRECT" in the last 72 hours. Thyroid function studies Recent Labs    04/21/22 2050  TSH 0.694   Anemia work up No results for input(s): "  VITAMINB12", "FOLATE", "FERRITIN", "TIBC", "IRON", "RETICCTPCT" in the last 72 hours. Urinalysis    Component Value Date/Time   COLORURINE YELLOW 02/17/2022 1334   APPEARANCEUR CLEAR 02/17/2022 1334   LABSPEC 1.030 02/17/2022 1334   PHURINE 5.0 02/17/2022 1334   GLUCOSEU >=500 (A) 02/17/2022 1334   HGBUR NEGATIVE 02/17/2022 1334   BILIRUBINUR NEGATIVE 02/17/2022 1334   KETONESUR 5 (A) 02/17/2022 1334   PROTEINUR 30 (A) 02/17/2022 1334   UROBILINOGEN 0.2 10/01/2009 1723   NITRITE NEGATIVE 02/17/2022 1334   LEUKOCYTESUR NEGATIVE 02/17/2022 1334   Sepsis Labs Recent Labs  Lab 04/21/22 2050 04/23/22 0400  WBC 7.7 9.5   Microbiology Recent Results (from the past 240 hour(s))  MRSA Next Gen by PCR, Nasal     Status: None   Collection Time: 04/22/22  1:43 AM   Specimen: Nasal Mucosa; Nasal Swab  Result Value Ref Range Status   MRSA by PCR Next Gen  NOT DETECTED NOT DETECTED Final    Comment: (NOTE) The GeneXpert MRSA Assay (FDA approved for NASAL specimens only), is one component of a comprehensive MRSA colonization surveillance program. It is not intended to diagnose MRSA infection nor to guide or monitor treatment for MRSA infections. Test performance is not FDA approved in patients less than 42 years old. Performed at [facility], [address] [place], [place] [address]      Time coordinating discharge: 35 minutes  SIGNED:   Rodena Goldmann, DO [facility] 04/24/2022, 9:57 AM  If 7PM-7AM, please contact night-coverage [email or web address]

## 2022-04-24 NOTE — Evaluation (Signed)
Physical Therapy Evaluation Patient Details Name: Keith Ewing MRN: [id number] DOB: [birth year] Today's Date: 04/24/2022  History of Present Illness  Keith Ewing is a 57 y.o. male with medical history significant of type 2 diabetes mellitus and tobacco abuse who presents to the emergency department due to generalized weakness and feeling nauseous which started today.  He denies shortness of breath, chest pain, fever, chills, abdominal pain.  EMS was activated and patient was taken to the ED for further evaluation and management.   Clinical Impression  Patient functioning near baseline for functional mobility and gait demonstrating good return for bed mobility, transfers and ambulating in room/hallways without loss of balance.  Plan:  Patient discharged from physical therapy to care of nursing for ambulation daily as tolerated for length of stay.         Recommendations for follow up therapy are one component of a multi-disciplinary discharge planning process, led by the attending physician.  Recommendations may be updated based on patient status, additional functional criteria and insurance authorization.  Follow Up Recommendations No PT follow up      Assistance Recommended at Discharge PRN  Patient can return home with the following  Help with stairs or ramp for entrance    Equipment Recommendations None recommended by PT  Recommendations for Other Services       Functional Status Assessment Patient has not had a recent decline in their functional status     Precautions / Restrictions Precautions Precautions: Fall Restrictions Weight Bearing Restrictions: No      Mobility  Bed Mobility Overal bed mobility: Independent                  Transfers Overall transfer level: Modified independent                      Ambulation/Gait Ambulation/Gait assistance: Modified independent (Device/Increase time) Gait Distance (Feet): 200 Feet Assistive device:  None Gait Pattern/deviations: WFL(Within Functional Limits) Gait velocity: slightly decreased     General Gait Details: grossly WFL with good return for ambulating in room/hallways without loss of balance  Stairs            Wheelchair Mobility    Modified Rankin (Stroke Patients Only)       Balance Overall balance assessment: No apparent balance deficits (not formally assessed)                                           Pertinent Vitals/Pain Pain Assessment Pain Assessment: No/denies pain    Home Living Family/patient expects to be discharged to:: Private residence Living Arrangements: Parent Available Help at Discharge: Family;Available 24 hours/day Type of Home: House Home Access: Level entry       Home Layout: One level Home Equipment: None;Grab bars - tub/shower      Prior Function Prior Level of Function : Independent/Modified Independent;Driving             Mobility Comments: Community ambulator without AD, drives ADLs Comments: Independent     Hand Dominance        Extremity/Trunk Assessment   Upper Extremity Assessment Upper Extremity Assessment: Overall WFL for tasks assessed    Lower Extremity Assessment Lower Extremity Assessment: Overall WFL for tasks assessed    Cervical / Trunk Assessment Cervical / Trunk Assessment: Normal  Communication   Communication: No difficulties  Cognition Arousal/Alertness:  Awake/alert Behavior During Therapy: WFL for tasks assessed/performed Overall Cognitive Status: Within Functional Limits for tasks assessed                                          General Comments      Exercises     Assessment/Plan    PT Assessment Patient does not need any further PT services  PT Problem List         PT Treatment Interventions      PT Goals (Current goals can be found in the Care Plan section)  Acute Rehab PT Goals Patient Stated Goal: return home PT Goal  Formulation: With patient Time For Goal Achievement: 04/24/22 Potential to Achieve Goals: Good    Frequency       Co-evaluation               AM-PAC PT "6 Clicks" Mobility  Outcome Measure Help needed turning from your back to your side while in a flat bed without using bedrails?: None Help needed moving from lying on your back to sitting on the side of a flat bed without using bedrails?: None Help needed moving to and from a bed to a chair (including a wheelchair)?: None Help needed standing up from a chair using your arms (e.g., wheelchair or bedside chair)?: None Help needed to walk in hospital room?: None Help needed climbing 3-5 steps with a railing? : A Little 6 Click Score: 23    End of Session   Activity Tolerance: Patient tolerated treatment well Patient left: in bed;with call bell/phone within reach Nurse Communication: Mobility status PT Visit Diagnosis: Unsteadiness on feet (R26.81);Other abnormalities of gait and mobility (R26.89);Muscle weakness (generalized) (M62.81)    Time: [id number] PT Time Calculation (min) (ACUTE ONLY): 10 min   Charges:   PT Evaluation $PT Eval Low Complexity: 1 Low PT Treatments $Therapeutic Activity: 8-22 mins        10:48 AM, 04/24/22 Lonell Grandchild, MPT Physical Therapist with [facility] [phone] office [phone] mobile phone

## 2022-05-03 ENCOUNTER — Ambulatory Visit: Payer: No Typology Code available for payment source | Admitting: Student

## 2022-05-04 ENCOUNTER — Ambulatory Visit: Payer: No Typology Code available for payment source | Attending: Student | Admitting: Internal Medicine

## 2022-05-04 ENCOUNTER — Encounter: Payer: Self-pay | Admitting: Internal Medicine

## 2022-05-04 VITALS — BP 132/82 | HR 80 | Ht 67.0 in | Wt 170.0 lb

## 2022-05-04 DIAGNOSIS — I4891 Unspecified atrial fibrillation: Secondary | ICD-10-CM | POA: Diagnosis not present

## 2022-05-04 DIAGNOSIS — I10 Essential (primary) hypertension: Secondary | ICD-10-CM | POA: Insufficient documentation

## 2022-05-04 DIAGNOSIS — I480 Paroxysmal atrial fibrillation: Secondary | ICD-10-CM

## 2022-05-04 MED ORDER — DILTIAZEM HCL ER COATED BEADS 120 MG PO CP24: 120.0000 mg | ORAL_CAPSULE | Freq: Every day | ORAL | 3 refills | Status: AC

## 2022-05-04 MED ORDER — APIXABAN 5 MG PO TABS: 5.0000 mg | ORAL_TABLET | Freq: Two times a day (BID) | ORAL | 11 refills | Status: AC

## 2022-05-04 NOTE — Patient Instructions (Signed)
Medication Instructions:   Your physician has recommended you make the following change in your medication:   Stop Taking Aspirin  Start Taking Eliquis 5 mg Two Times Daily   Talk with your PCP regarding a sleep study .   *If you need a refill on your cardiac medications before your next appointment, please call your pharmacy*   Lab Work: NONE   If you have labs (blood work) drawn today and your tests are completely normal, you will receive your results only by: [place] (if you have MyChart) OR A paper copy in the mail If you have any lab test that is abnormal or we need to change your treatment, we will call you to review the results.   Testing/Procedures: NONE    Follow-Up: At [facility], you and your health needs are our priority.  As part of our continuing mission to provide you with exceptional heart care, we have created designated Provider Care Teams.  These Care Teams include your primary Cardiologist (physician) and Advanced Practice Providers (APPs -  Physician Assistants and Nurse Practitioners) who all work together to provide you with the care you need, when you need it.  We recommend signing up for the patient portal called "MyChart".  Sign up information is provided on this After Visit Summary.  MyChart is used to connect with patients for Virtual Visits (Telemedicine).  Patients are able to view lab/test results, encounter notes, upcoming appointments, etc.  Non-urgent messages can be sent to your provider as well.   To learn more about what you can do with MyChart, go to [email or web address].    Your next appointment:   6 month(s)  Provider:   Claudina Lick, MD    Other Instructions Thank you for choosing [facility]!

## 2022-05-04 NOTE — Progress Notes (Signed)
Cardiology Office Note  Date: 05/04/2022   ID: [name], Gens [birth year], MRN [id number]  PCP:  Clinic, Keith Ewing  Cardiologist:  None Electrophysiologist:  None   Reason for Office Visit: Atrial fibrillation evaluation at the request of Dr. Manuella Ewing   History of Present Illness: Keith Ewing is a 57 y.o. male is a 57 year old M known to have HTN, DM 2 was referred to cardiology clinic for evaluation of atrial fibrillation diagnosed recently.  Patient was admitted to [facility] in 04/2022 with generalized abdominal pain and nausea, found to be in atrial fibrillation with RVR. Started on IV diltiazem drip, rate controlled and switched to p.o. diltiazem 120 mg once daily. Currently on aspirin 81 mg once daily. Never tested for OSA in the past. Denied having any palpitations, SOB, fatigue, chest pain, syncope and leg swelling. No prior history of MI/PCI/CABG. No prior ischemia evaluation.  Past Medical History:  Diagnosis Date   Diabetes mellitus without complication ([place])     Past Surgical History:  Procedure Laterality Date   APPENDECTOMY      Current Outpatient Medications  Medication Sig Dispense Refill   aspirin EC 81 MG tablet Take 1 tablet (81 mg total) by mouth daily. Swallow whole. 30 tablet 12   diltiazem (CARDIZEM CD) 120 MG 24 hr capsule Take 1 capsule (120 mg total) by mouth daily. 30 capsule 1   empagliflozin (JARDIANCE) 25 MG TABS tablet Take 25 mg by mouth daily.     Glucose 15 GM/32ML GEL TAKE 1 TUBE BY MOUTH AS DIRECTED BY YOUR PROVIDER FOR LOW BLOOD SUGAR     insulin aspart (NOVOLOG) 100 UNIT/ML injection Inject 6 Units into the skin 3 (three) times daily before meals. Pt takes on a sliding scale under 150 none over 151-200 6 units, 201-249 8 units, 250-300 10 units, 301 12 units     insulin glargine (LANTUS) 100 UNIT/ML injection Inject 40 Units into the skin 2 (two) times daily.     insulin glargine-yfgn (SEMGLEE) 100 UNIT/ML injection Inject 28  Units into the skin 2 (two) times daily.     lisinopril (ZESTRIL) 20 MG tablet Take 20 mg by mouth daily.     omeprazole (PRILOSEC) 20 MG capsule Take 1 capsule (20 mg total) by mouth daily. 30 capsule 0   perphenazine (TRILAFON) 2 MG tablet Take 1 tablet by mouth 2 (two) times daily.     rosuvastatin (CRESTOR) 20 MG tablet TAKE ONE-HALF TABLET BY MOUTH ONCE A DAY FOR CHOLESTEROL     Semaglutide,0.25 or 0.5MG /DOS, 2 MG/3ML SOPN      No current facility-administered medications for this visit.   Allergies:  Patient has no known allergies.   Social History: The patient  reports that he has been smoking. He uses smokeless tobacco. He reports that he does not drink alcohol.   Family History: The patient's family history is not on file.   ROS:  Please see the history of present illness. Otherwise, complete review of systems is positive for none.  All other systems are reviewed and negative.   Physical Exam: VS:  BP 132/82   Pulse 80   Ht 5\' 7"  (1.702 m)   Wt 170 lb (77.1 kg)   SpO2 99%   BMI 26.63 kg/m , BMI Body mass index is 26.63 kg/m.  Wt Readings from Last 3 Encounters:  05/04/22 170 lb (77.1 kg)  04/23/22 165 lb 5.5 oz (75 kg)  02/19/22 173 lb (78.5 kg)  General: Patient appears comfortable at rest. HEENT: Conjunctiva and lids normal, oropharynx clear with moist mucosa. Neck: Supple, no elevated JVP or carotid bruits, no thyromegaly. Lungs: Clear to auscultation, nonlabored breathing at rest. Cardiac: Regular rate and rhythm, no S3 or significant systolic murmur, no pericardial rub. Abdomen: Soft, nontender, no hepatomegaly, bowel sounds present, no guarding or rebound. Extremities: No pitting edema, distal pulses 2+. Skin: Warm and dry. Musculoskeletal: No kyphosis. Neuropsychiatric: Alert and oriented x3, affect grossly appropriate.  ECG:  An ECG dated 05/04/2022 was personally reviewed today and demonstrated:  Normal sinus rhythm  Recent Labwork: 02/19/2022: B  Natriuretic Peptide 9.0 04/21/2022: TSH 0.694 04/23/2022: ALT 13; AST 29; Hemoglobin 13.4; Platelets 289 04/24/2022: BUN 42; Creatinine, Ser 2.02; Magnesium 2.1; Potassium 3.7; Sodium 134  No results found for: "CHOL", "TRIG", "HDL", "CHOLHDL", "VLDL", "LDLCALC", "LDLDIRECT"  Other Studies Reviewed Today: Echocardiogram in 04/2022 LVEF normal Diastology could not be evaluated due to A-fib No valve abnormalities  Assessment and Plan: Patient is a 57 year old M known to have HTN, DM 2 was referred to cardiology clinic for evaluation of new onset atrial fibrillation.  # Paroxysmal A-fib, CV score 2 (EKG today showed normal sinus rhythm) -Continue p.o. diltiazem 120 mg once daily -Discontinue aspirin and start Eliquis 5 mg twice daily -Outpatient OSA evaluation by PCP (has upcoming appointment with PCP at [facility], recommended him to get sleep apnea testing at the [facility])  # HTN associated with DM 2, controlled -Continue lisinopril 20 mg once daily  # HLD, unknown values -Continue rosuvastatin 20 mg nightly.  Goal LDL less than 100. -Management of HLD per PCP  I have spent a total of 45 minutes with patient reviewing chart, EKGs, labs and examining patient as well as establishing an assessment and plan that was discussed with the patient.  > 50% of time was spent in direct patient care.     Medication Adjustments/Labs and Tests Ordered: Current medicines are reviewed at length with the patient today.  Concerns regarding medicines are outlined above.   Tests Ordered: No orders of the defined types were placed in this encounter.   Medication Changes: No orders of the defined types were placed in this encounter.   Disposition:  Follow up  6 months  Signed, Keith Denison Fidel Levy, MD, 05/04/2022 8:59 AM    [facility] at [facility] [address], [place], [place] [address]

## 2022-11-07 ENCOUNTER — Ambulatory Visit: Payer: No Typology Code available for payment source | Attending: Internal Medicine | Admitting: Internal Medicine

## 2022-11-07 ENCOUNTER — Encounter: Payer: Self-pay | Admitting: Internal Medicine

## 2022-11-07 NOTE — Progress Notes (Signed)
Erroneous encounter - please disregard.

## 2023-03-26 ENCOUNTER — Other Ambulatory Visit: Payer: Self-pay

## 2023-03-26 ENCOUNTER — Encounter (HOSPITAL_COMMUNITY): Payer: Self-pay | Admitting: Emergency Medicine

## 2023-03-26 ENCOUNTER — Emergency Department (HOSPITAL_COMMUNITY): Payer: No Typology Code available for payment source

## 2023-03-26 ENCOUNTER — Emergency Department (HOSPITAL_COMMUNITY)
Admission: EM | Admit: 2023-03-26 | Discharge: 2023-03-26 | Disposition: A | Discharge status: Home / Self Care | Payer: No Typology Code available for payment source | Attending: Emergency Medicine | Admitting: Emergency Medicine

## 2023-03-26 DIAGNOSIS — E119 Type 2 diabetes mellitus without complications: Secondary | ICD-10-CM | POA: Diagnosis not present

## 2023-03-26 DIAGNOSIS — Y92002 Bathroom of unspecified non-institutional (private) residence single-family (private) house as the place of occurrence of the external cause: Secondary | ICD-10-CM | POA: Insufficient documentation

## 2023-03-26 DIAGNOSIS — F17210 Nicotine dependence, cigarettes, uncomplicated: Secondary | ICD-10-CM | POA: Diagnosis not present

## 2023-03-26 DIAGNOSIS — W1839XA Other fall on same level, initial encounter: Secondary | ICD-10-CM | POA: Diagnosis not present

## 2023-03-26 DIAGNOSIS — W19XXXA Unspecified fall, initial encounter: Secondary | ICD-10-CM

## 2023-03-26 DIAGNOSIS — S0990XA Unspecified injury of head, initial encounter: Secondary | ICD-10-CM | POA: Insufficient documentation

## 2023-03-26 HISTORY — DX: Unspecified intracranial injury with loss of consciousness status unknown, initial encounter: S06.9XAA

## 2023-03-26 LAB — CBG MONITORING, ED: Glucose-Capillary: 145 mg/dL — ABNORMAL HIGH (ref 70–99)

## 2023-03-26 NOTE — Discharge Instructions (Signed)
 You were evaluated in the Emergency Department and after careful evaluation, we did not find any emergent condition requiring admission or further testing in the hospital.  Your exam/testing today was overall reassuring.  CT scan did not show any significant injuries.  Recommend continued hydration at home regarding the diarrhea.  Please return to the Emergency Department if you experience any worsening of your condition.  Thank you for allowing us  to be a part of your care.

## 2023-03-26 NOTE — ED Triage Notes (Signed)
 Pt bib EMS after he fell ambulating to bathroom. Denies LOC. Pt states he hit back of head on tub. Denies pain. C-collar remains intact. CBG was 136 per EMS. EMS reports pt's mother stated that pt has had diarrhea for approximately 2 days.

## 2023-03-26 NOTE — ED Provider Notes (Signed)
 [facility]-EMERGENCY DEPT [facility] Emergency Department Provider Note MRN:  [id number]  Arrival date & time: 03/26/23     Chief Complaint   Fall   History of Present Illness   Keith Ewing is a 58 y.o. year-old male with a history of TBI, DM presenting to the ED with chief complaint of fall.  Slipped and fell and hit back of his head during his bath tonight.  No other complaints.  Recent diarrhea past 2 days, no abdominal pain.  Review of Systems  A thorough review of systems was obtained and all systems are negative except as noted in the HPI and PMH.   Patient's Health History    Past Medical History:  Diagnosis Date   Diabetes mellitus without complication (HCC)    Traumatic brain injury [facility])     Past Surgical History:  Procedure Laterality Date   APPENDECTOMY      History reviewed. No pertinent family history.  Social History   Socioeconomic History   Marital status: Single    Spouse name: Not on file   Number of children: Not on file   Years of education: Not on file   Highest education level: Not on file  Occupational History   Not on file  Tobacco Use   Smoking status: Some Days   Smokeless tobacco: Current   Tobacco comments:    5 cigarettes per day  Vaping Use   Vaping status: Never Used  Substance and Sexual Activity   Alcohol use: No   Drug use: Not on file   Sexual activity: Not Currently  Other Topics Concern   Not on file  Social History Narrative   Not on file   Social Drivers of Health   Financial Resource Strain: Low Risk  (10/19/2017)   Overall Financial Resource Strain (CARDIA)    Difficulty of Paying Living Expenses: Not very hard  Food Insecurity: No Food Insecurity (04/22/2022)   Hunger Vital Sign    Worried About Running Out of Food in the Last Year: Never true    Ran Out of Food in the Last Year: Never true  Transportation Needs: No Transportation Needs (04/22/2022)   PRAPARE - Administrator, Civil Service (Medical):  No    Lack of Transportation (Non-Medical): No  Physical Activity: Not on file  Stress: No Stress Concern Present (10/19/2017)   [employer] of Occupational Health - Occupational Stress Questionnaire    Feeling of Stress : Not at all  Social Connections: Unknown (10/19/2017)   Social Connection and Isolation Panel [NHANES]    Frequency of Communication with Friends and Family: More than three times a week    Frequency of Social Gatherings with Friends and Family: More than three times a week    Attends Religious Services: Patient declined    Active Member of Clubs or Organizations: Patient declined    Attends Banker Meetings: Patient declined    Marital Status: Patient declined  Intimate Partner Violence: Not At Risk (04/22/2022)   Humiliation, Afraid, Rape, and Kick questionnaire    Fear of Current or Ex-Partner: No    Emotionally Abused: No    Physically Abused: No    Sexually Abused: No     Physical Exam   Vitals:   03/26/23 0315 03/26/23 0321  BP: 112/77   Pulse: 96   Resp: (!) 21   Temp:  99.1 F (37.3 C)  SpO2: 95%     CONSTITUTIONAL:  well-appearing, NAD NEURO/PSYCH:  Alert  and oriented x 3, no focal deficits EYES:  eyes equal and reactive ENT/NECK:  no LAD, no JVD CARDIO:  regular rate, well-perfused, normal S1 and S2 PULM:  CTAB no wheezing or rhonchi GI/GU:  non-distended, non-tender MSK/SPINE:  No gross deformities, no edema SKIN:  no rash, atraumatic   *Additional and/or pertinent findings included in MDM below  Diagnostic and Interventional Summary    EKG Interpretation Date/Time:    Ventricular Rate:    PR Interval:    QRS Duration:    QT Interval:    QTC Calculation:   R Axis:      Text Interpretation:         Labs Reviewed  CBG MONITORING, ED - Abnormal; Notable for the following components:      Result Value   Glucose-Capillary 145 (*)    All other components within normal limits    CT HEAD WO CONTRAST ( )  Final  Result      Medications - No data to display   Procedures  /  Critical Care Procedures  ED Course and Medical Decision Making  Initial Impression and Ddx Fall with head trauma, h/o TBI with abnormal affect obtaining CT to exclude intracranial bleeding.  Suspect viral illness causing recent diarrhea.  Abdomen soft, non-tender  Past medical/surgical history that increases complexity of ED encounter:  TBI  Interpretation of Diagnostics CBG normal, CT head unremarkable.  Patient Reassessment and Ultimate Disposition/Management     Patient well-appearing on repeat assessment with no complaints, soft abdomen, appropriate for discharge.  Patient management required discussion with the following services or consulting groups:  None  Complexity of Problems Addressed Acute illness or injury that poses threat of life of bodily function  Additional Data Reviewed and Analyzed Further history obtained from: Further history from spouse/family member  Additional Factors Impacting ED Encounter Risk None  Merrick Abe. Harless Lien, MD [facility] Emergency Medicine [facility] [email or web address]  Final Clinical Impressions(s) / ED Diagnoses     ICD-10-CM   1. Fall, initial encounter  W19.[facility]       ED Discharge Orders     None        Discharge Instructions Discussed with and Provided to Patient:    Discharge Instructions      You were evaluated in the Emergency Department and after careful evaluation, we did not find any emergent condition requiring admission or further testing in the hospital.  Your exam/testing today was overall reassuring.  CT scan did not show any significant injuries.  Recommend continued hydration at home regarding the diarrhea.  Please return to the Emergency Department if you experience any worsening of your condition.  Thank you for allowing us  to be a part of your care.       Edson Graces, MD 03/26/23 [phone]

## 2023-03-26 NOTE — ED Notes (Signed)
 Pt to CT at this time.

## 2023-04-06 ENCOUNTER — Other Ambulatory Visit: Payer: Self-pay

## 2023-04-06 ENCOUNTER — Emergency Department (HOSPITAL_COMMUNITY): Payer: No Typology Code available for payment source

## 2023-04-06 ENCOUNTER — Emergency Department (HOSPITAL_COMMUNITY)
Admission: EM | Admit: 2023-04-06 | Discharge: 2023-04-06 | Disposition: A | Discharge status: Home / Self Care | Payer: No Typology Code available for payment source | Attending: Emergency Medicine | Admitting: Emergency Medicine

## 2023-04-06 DIAGNOSIS — Z794 Long term (current) use of insulin: Secondary | ICD-10-CM | POA: Diagnosis not present

## 2023-04-06 DIAGNOSIS — R4182 Altered mental status, unspecified: Secondary | ICD-10-CM | POA: Insufficient documentation

## 2023-04-06 DIAGNOSIS — Z7901 Long term (current) use of anticoagulants: Secondary | ICD-10-CM | POA: Insufficient documentation

## 2023-04-06 DIAGNOSIS — R404 Transient alteration of awareness: Secondary | ICD-10-CM | POA: Diagnosis not present

## 2023-04-06 DIAGNOSIS — R140 Abdominal distension (gaseous): Secondary | ICD-10-CM | POA: Insufficient documentation

## 2023-04-06 DIAGNOSIS — E119 Type 2 diabetes mellitus without complications: Secondary | ICD-10-CM | POA: Insufficient documentation

## 2023-04-06 DIAGNOSIS — Z743 Need for continuous supervision: Secondary | ICD-10-CM | POA: Diagnosis not present

## 2023-04-06 DIAGNOSIS — E162 Hypoglycemia, unspecified: Secondary | ICD-10-CM

## 2023-04-06 DIAGNOSIS — Z7984 Long term (current) use of oral hypoglycemic drugs: Secondary | ICD-10-CM | POA: Diagnosis not present

## 2023-04-06 DIAGNOSIS — J069 Acute upper respiratory infection, unspecified: Secondary | ICD-10-CM

## 2023-04-06 DIAGNOSIS — R6889 Other general symptoms and signs: Secondary | ICD-10-CM | POA: Diagnosis not present

## 2023-04-06 LAB — URINALYSIS, ROUTINE W REFLEX MICROSCOPIC
Bacteria, UA: NONE SEEN
Bilirubin Urine: NEGATIVE
Glucose, UA: NEGATIVE mg/dL
Ketones, ur: NEGATIVE mg/dL
Leukocytes,Ua: NEGATIVE
Nitrite: NEGATIVE
Protein, ur: NEGATIVE mg/dL
RBC / HPF: >50 RBC/hpf (ref 0–5)
Specific Gravity, Urine: 1.019 (ref 1.005–1.030)
pH: 6 (ref 5.0–8.0)

## 2023-04-06 LAB — COMPREHENSIVE METABOLIC PANEL
ALT: 13 U/L (ref 0–44)
AST: 39 U/L (ref 15–41)
Albumin: 3.7 g/dL (ref 3.5–5.0)
Alkaline Phosphatase: 81 U/L (ref 38–126)
Anion gap: 10 (ref 5–15)
BUN: 12 mg/dL (ref 6–20)
CO2: 26 mmol/L (ref 22–32)
Calcium: 9.6 mg/dL (ref 8.9–10.3)
Chloride: 103 mmol/L (ref 98–111)
Creatinine, Ser: 1.58 mg/dL — ABNORMAL HIGH (ref 0.61–1.24)
GFR, Estimated: 50 mL/min — ABNORMAL LOW (ref >60)
Glucose, Bld: 83 mg/dL (ref 70–99)
Potassium: 4.4 mmol/L (ref 3.5–5.1)
Sodium: 139 mmol/L (ref 135–145)
Total Bilirubin: 0.4 mg/dL (ref 0.0–1.2)
Total Protein: 7.4 g/dL (ref 6.5–8.1)

## 2023-04-06 LAB — CBG MONITORING, ED
Glucose-Capillary: 70 mg/dL (ref 70–99)
Glucose-Capillary: 62 mg/dL — ABNORMAL LOW (ref 70–99)
Glucose-Capillary: 199 mg/dL — ABNORMAL HIGH (ref 70–99)
Glucose-Capillary: 95 mg/dL (ref 70–99)

## 2023-04-06 LAB — CBC
HCT: 42.2 % (ref 39.0–52.0)
Hemoglobin: 13.1 g/dL (ref 13.0–17.0)
MCH: 27.1 pg (ref 26.0–34.0)
MCHC: 31 g/dL (ref 30.0–36.0)
MCV: 87.2 fL (ref 80.0–100.0)
Platelets: 464 10*3/uL — ABNORMAL HIGH (ref 150–400)
RBC: 4.84 MIL/uL (ref 4.22–5.81)
RDW: 12.2 % (ref 11.5–15.5)
WBC: 6.6 10*3/uL (ref 4.0–10.5)
nRBC: 0 % (ref 0.0–0.2)

## 2023-04-06 LAB — RESP PANEL BY RT-PCR (RSV, FLU A&B, COVID)  RVPGX2
Influenza A by PCR: NEGATIVE
Influenza B by PCR: NEGATIVE
Resp Syncytial Virus by PCR: NEGATIVE
SARS Coronavirus 2 by RT PCR: NEGATIVE

## 2023-04-06 LAB — ETHANOL: Alcohol, Ethyl (B): <10 mg/dL (ref <10)

## 2023-04-06 MED ORDER — DEXTROSE 50 % IV SOLN
1.0000 | Freq: Once | INTRAVENOUS | Status: AC
  Administered 2023-04-06: 50 mL via INTRAVENOUS
  Filled 2023-04-06: qty 50

## 2023-04-06 MED ORDER — DOXYCYCLINE HYCLATE 100 MG PO CAPS: 100.0000 mg | ORAL_CAPSULE | Freq: Two times a day (BID) | ORAL | 0 refills | Status: DC

## 2023-04-06 NOTE — ED Notes (Signed)
 Pt is alert to self, time, place.

## 2023-04-06 NOTE — ED Provider Notes (Signed)
 [place] EMERGENCY DEPARTMENT AT [facility] Provider Note   CSN: [id number] Arrival date & time: 04/06/23  1506     History  Chief Complaint  Patient presents with   Altered Mental Status    Keith Ewing is a 58 y.o. male.  Patient is a 58 year old male who presents to the emergency department with a chief complaint of confusion.  Patient was brought in by EMS.  On my exam patient notes that he does feel altered from his baseline at this point.  He notes that he is a diabetic and notes that his blood sugars have been running high at home.  Patient notes that he has no acute pain at this time.  He denies any abnormal headaches, pain to neck or back, chest pain, shortness of breath, abdominal pain, nausea, vomiting, diarrhea.  He notes that he did have a fall 2 weeks ago during which time he did strike his head.  He was evaluated in the emergency department with negative CT scan.  He has had no other new trauma since that time.   Altered Mental Status      Home Medications Prior to Admission medications   Medication Sig Start Date End Date Taking? Authorizing Provider  apixaban (ELIQUIS) 5 MG TABS tablet Take 1 tablet (5 mg total) by mouth 2 (two) times daily. 05/04/22   Mallipeddi, Vishnu P, MD  diltiazem (CARDIZEM CD) 120 MG 24 hr capsule Take 1 capsule (120 mg total) by mouth daily. 05/04/22 05/04/23  Mallipeddi, Vishnu P, MD  empagliflozin (JARDIANCE) 25 MG TABS tablet Take 25 mg by mouth daily. 02/09/22   [provider]  Glucose 15 GM/32ML GEL TAKE 1 TUBE BY MOUTH AS DIRECTED BY YOUR PROVIDER FOR LOW BLOOD SUGAR 10/07/21   [provider]  insulin aspart (NOVOLOG) 100 UNIT/ML injection Inject 6 Units into the skin 3 (three) times daily before meals. Pt takes on a sliding scale under 150 none over 151-200 6 units, 201-249 8 units, 250-300 10 units, 301 12 units    [provider]  insulin glargine (LANTUS) 100 UNIT/ML injection Inject 40 Units  into the skin 2 (two) times daily.    [provider]  insulin glargine-yfgn (SEMGLEE) 100 UNIT/ML injection Inject 28 Units into the skin 2 (two) times daily. 04/14/22   [provider]  lisinopril (ZESTRIL) 20 MG tablet Take 20 mg by mouth daily. 02/09/22   [provider]  omeprazole (PRILOSEC) 20 MG capsule Take 1 capsule (20 mg total) by mouth daily. 02/19/22   Zadie Rhine, MD  perphenazine (TRILAFON) 2 MG tablet Take 1 tablet by mouth 2 (two) times daily. 11/17/21   [provider]  rosuvastatin (CRESTOR) 20 MG tablet TAKE ONE-HALF TABLET BY MOUTH ONCE A DAY FOR CHOLESTEROL 05/02/22   [provider]  Semaglutide,0.25 or 0.5MG /DOS, 2 MG/3ML SOPN  02/03/22   [provider]      Allergies    Patient has no known allergies.    Review of Systems   Review of Systems  Neurological:        Confusion  All other systems reviewed and are negative.   Physical Exam Updated Vital Signs BP 128/80   Pulse 97   Temp 99.5 F (37.5 C) (Oral)   Resp 15   SpO2 100%  Physical Exam Constitutional:      Appearance: Normal appearance.  HENT:     Head: Normocephalic and atraumatic.     Nose: Nose normal.  Mouth/Throat:     Mouth: Mucous membranes are moist.  Eyes:     Extraocular Movements: Extraocular movements intact.     Conjunctiva/sclera: Conjunctivae normal.     Pupils: Pupils are equal, round, and reactive to light.  Cardiovascular:     Rate and Rhythm: Normal rate and regular rhythm.     Pulses: Normal pulses.     Heart sounds: Normal heart sounds. No murmur heard.    No gallop.  Pulmonary:     Effort: Pulmonary effort is normal. No respiratory distress.     Breath sounds: Normal breath sounds. No stridor. No wheezing or rhonchi.  Abdominal:     General: Abdomen is flat. Bowel sounds are normal. There is distension.     Palpations: Abdomen is soft. There is no mass.     Tenderness: There is no abdominal tenderness. There  is no guarding or rebound.     Hernia: No hernia is present.  Musculoskeletal:        General: Normal range of motion.     Cervical back: Normal range of motion and neck supple.  Skin:    General: Skin is warm and dry.     Findings: No bruising, erythema or rash.  Neurological:     General: No focal deficit present.     Mental Status: He is alert and oriented to person, place, and time. Mental status is at baseline.     Cranial Nerves: No cranial nerve deficit.     Sensory: No sensory deficit.     Motor: No weakness.     Coordination: Coordination normal.     Gait: Gait normal.     Deep Tendon Reflexes: Reflexes normal.  Psychiatric:        Mood and Affect: Mood normal.        Behavior: Behavior normal.        Thought Content: Thought content normal.        Judgment: Judgment normal.     ED Results / Procedures / Treatments   Labs (all labs ordered are listed, but only abnormal results are displayed) Labs Reviewed  CBC - Abnormal; Notable for the following components:      Result Value   Platelets 464 (*)    All other components within normal limits  COMPREHENSIVE METABOLIC PANEL - Abnormal; Notable for the following components:   Creatinine, Ser 1.58 (*)    GFR, Estimated 50 (*)    All other components within normal limits  CBG MONITORING, ED - Abnormal; Notable for the following components:   Glucose-Capillary 62 (*)    All other components within normal limits  RESP PANEL BY RT-PCR (RSV, FLU A&B, COVID)  RVPGX2  ETHANOL  URINALYSIS, ROUTINE W REFLEX MICROSCOPIC  CBG MONITORING, ED    EKG None  Radiology CT HEAD WO CONTRAST Result Date: 04/06/2023 CLINICAL DATA:  Larey Seat and hit head 1 week ago, altered level of consciousness EXAM: CT HEAD WITHOUT CONTRAST TECHNIQUE: Contiguous axial images were obtained from the base of the skull through the vertex without intravenous contrast. RADIATION DOSE REDUCTION: This exam was performed according to the departmental  dose-optimization program which includes automated exposure control, adjustment of the mA and/or kV according to patient size and/or use of iterative reconstruction technique. COMPARISON:  03/26/2023 FINDINGS: Brain: No acute infarct or hemorrhage. Lateral ventricles and midline structures are stable. No acute extra-axial fluid collections. No mass effect. Stable cerebral atrophy. Vascular: No hyperdense vessel or unexpected calcification. Skull: Normal. Negative for fracture  or focal lesion. Sinuses/Orbits: No acute finding. Other: None. IMPRESSION: 1. Stable head CT, no acute intracranial process. Electronically Signed   By: Sharlet Salina M.D.   On: 04/06/2023 17:53    Procedures Procedures    Medications Ordered in ED Medications  dextrose 50 % solution 50 mL (has no administration in time range)    ED Course/ Medical Decision Making/ A&P                                 Medical Decision Making Patient does remain stable at this time.  His confusion has resolved after receiving D50 in the emergency department.  He is currently eating at this time and will continue to monitor in the emergency department for dispo.  Will sign patient out to attending physician Dr. Estell Harpin at this time.  Workup in the emergency department has been otherwise unremarkable with a CT scan of the head demonstrated no acute intracranial hemorrhage or acute ischemic changes.  He has no concerning neurological deficits on exam.  Blood work was otherwise unremarkable with no changes in kidney function, liver function, electrolytes.  Do not suspect CVA, TIA, meningitis, sepsis, space-occupying lesion of the brain at this point.  Amount and/or Complexity of Data Reviewed Labs: ordered. Radiology: ordered.  Risk Prescription drug management.           Final Clinical Impression(s) / ED Diagnoses Final diagnoses:  None    Rx / DC Orders ED Discharge Orders     None         Lelon Perla,  PA-C 04/07/23 4098    Bethann Berkshire, MD 04/08/23 1025

## 2023-04-06 NOTE — ED Notes (Signed)
 Upon reassessment BGL is now 199 and pt is Aox4.

## 2023-04-06 NOTE — ED Triage Notes (Addendum)
 Pt BIB [facility] from home with reports of AMS. Pt reports to this nurse that he feels at his baseline. Pt is oriented x 4 at this time. No unilateral weakness. Pt does reports falling and hitting his head 1 week ago. Pt denies pain. BS for Ems 91.

## 2023-04-06 NOTE — Discharge Instructions (Signed)
 Follow-up with your doctors at the [place] next week.  Make sure you eat at least 3 meals a day.  You are placed on antibiotics for this congestion your had.  Your chest x-ray did not show any pneumonia

## 2023-04-10 DIAGNOSIS — R9431 Abnormal electrocardiogram [ECG] [EKG]: Secondary | ICD-10-CM | POA: Diagnosis not present

## 2023-04-10 DIAGNOSIS — E119 Type 2 diabetes mellitus without complications: Secondary | ICD-10-CM | POA: Diagnosis not present

## 2024-01-04 ENCOUNTER — Encounter: Payer: Self-pay | Admitting: *Deleted

## 2024-01-04 NOTE — Progress Notes (Signed)
 ISSAIH KAUS                                          MRN: [id number]   01/04/2024   The [facility] Quality Team Specialist reviewed this patient medical record for the purposes of chart review for care gap closure. The following were reviewed: chart review for care gap closure-glycemic status assessment.    VBCI Quality Team

## 2024-03-02 ENCOUNTER — Observation Stay (HOSPITAL_COMMUNITY): Admission: EM | Admit: 2024-03-02 | Discharge: 2024-03-04 | Disposition: A

## 2024-03-02 ENCOUNTER — Emergency Department (HOSPITAL_COMMUNITY)

## 2024-03-02 ENCOUNTER — Encounter (HOSPITAL_COMMUNITY): Payer: Self-pay | Admitting: Emergency Medicine

## 2024-03-02 ENCOUNTER — Other Ambulatory Visit: Payer: Self-pay

## 2024-03-02 DIAGNOSIS — Z794 Long term (current) use of insulin: Secondary | ICD-10-CM | POA: Insufficient documentation

## 2024-03-02 DIAGNOSIS — Z7901 Long term (current) use of anticoagulants: Secondary | ICD-10-CM | POA: Diagnosis not present

## 2024-03-02 DIAGNOSIS — E11649 Type 2 diabetes mellitus with hypoglycemia without coma: Principal | ICD-10-CM | POA: Insufficient documentation

## 2024-03-02 DIAGNOSIS — Z23 Encounter for immunization: Secondary | ICD-10-CM | POA: Diagnosis not present

## 2024-03-02 DIAGNOSIS — R7401 Elevation of levels of liver transaminase levels: Secondary | ICD-10-CM | POA: Insufficient documentation

## 2024-03-02 DIAGNOSIS — B882 Other arthropod infestations: Secondary | ICD-10-CM | POA: Diagnosis not present

## 2024-03-02 DIAGNOSIS — E785 Hyperlipidemia, unspecified: Secondary | ICD-10-CM | POA: Insufficient documentation

## 2024-03-02 DIAGNOSIS — E162 Hypoglycemia, unspecified: Principal | ICD-10-CM | POA: Diagnosis present

## 2024-03-02 DIAGNOSIS — T68XXXA Hypothermia, initial encounter: Secondary | ICD-10-CM | POA: Diagnosis not present

## 2024-03-02 DIAGNOSIS — E1122 Type 2 diabetes mellitus with diabetic chronic kidney disease: Secondary | ICD-10-CM | POA: Insufficient documentation

## 2024-03-02 DIAGNOSIS — N1831 Chronic kidney disease, stage 3a: Secondary | ICD-10-CM | POA: Insufficient documentation

## 2024-03-02 DIAGNOSIS — I480 Paroxysmal atrial fibrillation: Secondary | ICD-10-CM | POA: Diagnosis not present

## 2024-03-02 DIAGNOSIS — G9341 Metabolic encephalopathy: Secondary | ICD-10-CM | POA: Diagnosis not present

## 2024-03-02 DIAGNOSIS — Z79899 Other long term (current) drug therapy: Secondary | ICD-10-CM | POA: Diagnosis not present

## 2024-03-02 DIAGNOSIS — I129 Hypertensive chronic kidney disease with stage 1 through stage 4 chronic kidney disease, or unspecified chronic kidney disease: Secondary | ICD-10-CM | POA: Insufficient documentation

## 2024-03-02 DIAGNOSIS — F17200 Nicotine dependence, unspecified, uncomplicated: Secondary | ICD-10-CM | POA: Insufficient documentation

## 2024-03-02 DIAGNOSIS — K219 Gastro-esophageal reflux disease without esophagitis: Secondary | ICD-10-CM | POA: Insufficient documentation

## 2024-03-02 DIAGNOSIS — R339 Retention of urine, unspecified: Secondary | ICD-10-CM | POA: Insufficient documentation

## 2024-03-02 DIAGNOSIS — R410 Disorientation, unspecified: Secondary | ICD-10-CM | POA: Diagnosis not present

## 2024-03-02 DIAGNOSIS — X31XXXA Exposure to excessive natural cold, initial encounter: Secondary | ICD-10-CM | POA: Insufficient documentation

## 2024-03-02 DIAGNOSIS — R4182 Altered mental status, unspecified: Secondary | ICD-10-CM | POA: Diagnosis present

## 2024-03-02 LAB — COMPREHENSIVE METABOLIC PANEL WITH GFR
ALT: 21 U/L (ref 0–44)
AST: 58 U/L — ABNORMAL HIGH (ref 15–41)
Albumin: 4.1 g/dL (ref 3.5–5.0)
Alkaline Phosphatase: 102 U/L (ref 38–126)
Anion gap: 17 — ABNORMAL HIGH (ref 5–15)
BUN: 15 mg/dL (ref 6–20)
CO2: 19 mmol/L — ABNORMAL LOW (ref 22–32)
Calcium: 9.4 mg/dL (ref 8.9–10.3)
Chloride: 102 mmol/L (ref 98–111)
Creatinine, Ser: 1.4 mg/dL — ABNORMAL HIGH (ref 0.61–1.24)
GFR, Estimated: 58 mL/min — ABNORMAL LOW
Glucose, Bld: 182 mg/dL — ABNORMAL HIGH (ref 70–99)
Potassium: 4 mmol/L (ref 3.5–5.1)
Sodium: 138 mmol/L (ref 135–145)
Total Bilirubin: <0.2 mg/dL (ref 0.0–1.2)
Total Protein: 7.4 g/dL (ref 6.5–8.1)

## 2024-03-02 LAB — PROTIME-INR
INR: 1 (ref 0.8–1.2)
Prothrombin Time: 14.2 s (ref 11.4–15.2)

## 2024-03-02 LAB — URINE DRUG SCREEN
Amphetamines: NEGATIVE
Barbiturates: NEGATIVE
Benzodiazepines: NEGATIVE
Cocaine: NEGATIVE
Fentanyl: NEGATIVE
Methadone Scn, Ur: NEGATIVE
Opiates: NEGATIVE
Tetrahydrocannabinol: NEGATIVE

## 2024-03-02 LAB — BLOOD GAS, ARTERIAL
Acid-Base Excess: 0.5 mmol/L (ref 0.0–2.0)
Bicarbonate: 25.4 mmol/L (ref 20.0–28.0)
Drawn by: 65579
O2 Saturation: 98.2 %
Patient temperature: 34.2
pCO2 arterial: 36 mmHg (ref 32–48)
pH, Arterial: 7.44 (ref 7.35–7.45)
pO2, Arterial: 78 mmHg — ABNORMAL LOW (ref 83–108)

## 2024-03-02 LAB — URINALYSIS, ROUTINE W REFLEX MICROSCOPIC
Bacteria, UA: NONE SEEN
Bilirubin Urine: NEGATIVE
Glucose, UA: >=500 mg/dL — AB
Ketones, ur: NEGATIVE mg/dL
Leukocytes,Ua: NEGATIVE
Nitrite: NEGATIVE
Protein, ur: 30 mg/dL — AB
RBC / HPF: >50 RBC/hpf (ref 0–5)
Specific Gravity, Urine: 1.021 (ref 1.005–1.030)
pH: 5 (ref 5.0–8.0)

## 2024-03-02 LAB — CBG MONITORING, ED
Glucose-Capillary: 72 mg/dL (ref 70–99)
Glucose-Capillary: 183 mg/dL — ABNORMAL HIGH (ref 70–99)
Glucose-Capillary: 95 mg/dL (ref 70–99)
Glucose-Capillary: 129 mg/dL — ABNORMAL HIGH (ref 70–99)

## 2024-03-02 LAB — APTT: aPTT: 33 s (ref 24–36)

## 2024-03-02 LAB — CBC
HCT: 39.6 % (ref 39.0–52.0)
Hemoglobin: 12.7 g/dL — ABNORMAL LOW (ref 13.0–17.0)
MCH: 27.9 pg (ref 26.0–34.0)
MCHC: 32.1 g/dL (ref 30.0–36.0)
MCV: 87 fL (ref 80.0–100.0)
Platelets: 332 K/uL (ref 150–400)
RBC: 4.55 MIL/uL (ref 4.22–5.81)
RDW: 12.5 % (ref 11.5–15.5)
WBC: 6.7 K/uL (ref 4.0–10.5)
nRBC: 0 % (ref 0.0–0.2)

## 2024-03-02 LAB — LACTIC ACID, PLASMA
Lactic Acid, Venous: 3.9 mmol/L (ref 0.5–1.9)
Lactic Acid, Venous: 1.5 mmol/L (ref 0.5–1.9)

## 2024-03-02 LAB — GLUCOSE, CAPILLARY: Glucose-Capillary: 137 mg/dL — ABNORMAL HIGH (ref 70–99)

## 2024-03-02 LAB — ETHANOL: Alcohol, Ethyl (B): <15 mg/dL

## 2024-03-02 MED ORDER — INFLUENZA VIRUS VACC SPLIT PF (FLUZONE) 0.5 ML IM SUSY
0.5000 mL | PREFILLED_SYRINGE | INTRAMUSCULAR | Status: AC
  Administered 2024-03-03: 0.5 mL via INTRAMUSCULAR
  Filled 2024-03-02: qty 0.5

## 2024-03-02 MED ORDER — PROCHLORPERAZINE EDISYLATE 10 MG/2ML IJ SOLN: 5.0000 mg | Freq: Four times a day (QID) | INTRAMUSCULAR | Status: DC | PRN

## 2024-03-02 MED ORDER — LACTATED RINGERS IV BOLUS
30.0000 mL/kg | Freq: Once | INTRAVENOUS | Status: AC
  Administered 2024-03-02: 2301 mL via INTRAVENOUS

## 2024-03-02 MED ORDER — PNEUMOCOCCAL 20-VAL CONJ VACC 0.5 ML IM SUSY
0.5000 mL | PREFILLED_SYRINGE | INTRAMUSCULAR | Status: AC
  Administered 2024-03-03: 0.5 mL via INTRAMUSCULAR
  Filled 2024-03-02: qty 0.5

## 2024-03-02 MED ORDER — APIXABAN 5 MG PO TABS
5.0000 mg | ORAL_TABLET | Freq: Two times a day (BID) | ORAL | Status: DC
  Administered 2024-03-02 – 2024-03-04 (×4): 5 mg via ORAL
  Filled 2024-03-02 (×4): qty 1

## 2024-03-02 MED ORDER — MELATONIN 3 MG PO TABS: 6.0000 mg | ORAL_TABLET | Freq: Every evening | ORAL | Status: DC | PRN

## 2024-03-02 MED ORDER — PANTOPRAZOLE SODIUM 40 MG PO TBEC
40.0000 mg | DELAYED_RELEASE_TABLET | Freq: Every day | ORAL | Status: DC
  Administered 2024-03-02 – 2024-03-04 (×3): 40 mg via ORAL
  Filled 2024-03-02 (×3): qty 1

## 2024-03-02 MED ORDER — LACTATED RINGERS IV SOLN: INTRAVENOUS | Status: AC

## 2024-03-02 MED ORDER — SODIUM CHLORIDE 0.9 % IV SOLN
2.0000 g | Freq: Once | INTRAVENOUS | Status: AC
  Administered 2024-03-02: 2 g via INTRAVENOUS
  Filled 2024-03-02: qty 12.5

## 2024-03-02 MED ORDER — LIDOCAINE HCL URETHRAL/MUCOSAL 2 % EX GEL
1.0000 | Freq: Once | CUTANEOUS | Status: DC
  Filled 2024-03-02: qty 5

## 2024-03-02 MED ORDER — ACETAMINOPHEN 325 MG PO TABS: 650.0000 mg | ORAL_TABLET | Freq: Four times a day (QID) | ORAL | Status: DC | PRN

## 2024-03-02 MED ORDER — POLYETHYLENE GLYCOL 3350 17 G PO PACK: 17.0000 g | PACK | Freq: Every day | ORAL | Status: DC | PRN

## 2024-03-02 MED ORDER — ROSUVASTATIN CALCIUM 10 MG PO TABS
10.0000 mg | ORAL_TABLET | Freq: Every day | ORAL | Status: DC
  Administered 2024-03-02 – 2024-03-04 (×3): 10 mg via ORAL
  Filled 2024-03-02 (×3): qty 1

## 2024-03-02 MED ORDER — VANCOMYCIN HCL IN DEXTROSE 1-5 GM/200ML-% IV SOLN
1000.0000 mg | Freq: Once | INTRAVENOUS | Status: AC
  Administered 2024-03-02: 1000 mg via INTRAVENOUS
  Filled 2024-03-02: qty 200

## 2024-03-02 MED ORDER — METRONIDAZOLE 500 MG/100ML IV SOLN
500.0000 mg | Freq: Two times a day (BID) | INTRAVENOUS | Status: DC
  Administered 2024-03-02 – 2024-03-03 (×2): 500 mg via INTRAVENOUS
  Filled 2024-03-02 (×2): qty 100

## 2024-03-02 NOTE — ED Notes (Signed)
 Pt was able to urinate about 40 mL on his own with a urinal.  Pt does not feel like he has to urinate anymore.

## 2024-03-02 NOTE — ED Provider Notes (Signed)
 Signout received from Julie Idol, PA-C.  In short patient is a 59 year old male with history of hyperlipidemia, hypertension, diabetes, TBI, A-fib on Eliquis  who presents with altered mental status.  Found to be hypoglycemic and hypothermic to 93.6.  Without any infectious symptoms.  At time of signout patient's lab work is notable for no leukocytosis, CMP with anion gap of 17 and bicarb of 19, clear chest x-ray and CT head.  Pending call from hospitalist. Physical Exam  BP 134/83   Pulse 65   Temp 97.7 F (36.5 C) (Oral)   Resp 15   Ht 5' 7 (1.702 m)   Wt 76.7 kg   SpO2 98%   BMI 26.47 kg/m   Physical Exam Vitals and nursing note reviewed.  Constitutional:      General: He is not in acute distress.    Appearance: He is well-developed.  HENT:     Head: Normocephalic and atraumatic.  Eyes:     Conjunctiva/sclera: Conjunctivae normal.  Cardiovascular:     Rate and Rhythm: Normal rate and regular rhythm.     Heart sounds: No murmur heard. Pulmonary:     Effort: Pulmonary effort is normal. No respiratory distress.     Breath sounds: Normal breath sounds.  Abdominal:     Palpations: Abdomen is soft.     Tenderness: There is no abdominal tenderness.  Musculoskeletal:        General: No swelling.     Cervical back: Neck supple.  Skin:    General: Skin is warm and dry.     Capillary Refill: Capillary refill takes less than 2 seconds.  Neurological:     Mental Status: He is alert.     Comments: Patient is alert and oriented. There is no abnormal phonation. Symmetric smile without facial droop. No pronator drift. Moves all extremities spontaneously. 5/5 strength in upper and lower extremities. . No sensation deficit. There is no nystagmus. EOMI, PERRL.   Psychiatric:        Mood and Affect: Mood normal.     Procedures  Procedures  ED Course / MDM     Medical Decision Making Amount and/or Complexity of Data Reviewed Labs: ordered. Radiology:  ordered.  Risk Prescription drug management. Decision regarding hospitalization.   Bladder scan with 300. Techs with difficulty with foley. Will attempt. Discussed patient with Dr. Shona agreed for admission.        Donnajean Lynwood DEL, PA-C 03/02/24 1943    Towana Ozell BROCKS, MD 03/03/24 [phone]

## 2024-03-02 NOTE — ED Notes (Signed)
 Second attempt at urinary catheter. Patient experiencing significant discomfort. No urine return. Catheter pulled

## 2024-03-02 NOTE — ED Notes (Signed)
 Second lactic acid drawn just prior to initiating fluids.

## 2024-03-02 NOTE — ED Triage Notes (Signed)
 Per EMS pt coming from home where family called for patient sitting up and was diaphoertic and drooling. Ems report pt going in and out of consciousness. CBG 49 and administered glucagon . Unable to obtain IV access en route. CBG 72 on arrival. Patient shaking on arrival. Bed bugs found on patient.

## 2024-03-02 NOTE — ED Notes (Signed)
 Bladder scanned pt. Bladder scan showed around   Pt tried to urinate on his own with a urinal multiple times but was unable to.

## 2024-03-02 NOTE — ED Notes (Signed)
 Report given to [facility] of [facility] at this time. No questions, comments, or concerns after report was given.

## 2024-03-02 NOTE — H&P (Addendum)
 " History and Physical  Keith Ewing FMW:[id number] DOB: [birth year] DOA: 03/02/2024  Referring physician: Donnajean BROTHERS  PCP: [facility]  Outpatient Specialists: Cardiology Patient coming from: Home  Chief Complaint: Altered mental status.  HPI: Keith Ewing is a 59 y.o. male with medical history significant for insulin -dependent type 2 diabetes, paroxysmal A-fib on Eliquis , hypertension, hyperlipidemia, GERD, who presents to the ER due to altered mental status and severe hypoglycemia.  The patient lives at home with his mother.  He was found sitting up, diaphoretic, drooling, and going in and out of consciousness.  The patient was in his usual state of health yesterday.  EMS was activated.  Upon EMS arrival, the patient was minimally responsive.  Severely hypoglycemic with CBG of 49.  He was administered IM glucagon .  Brought into the ER for further evaluation.  In the ER, tremulous, repeat CBG of 72.  Bedbugs found on patient.  Hypothermic with temperature of 93.6 F, placed on Bair hugger with improvement to 97.7.  Patient's mental status improved and returned to baseline in the ER.  Noncontrast head CT revealed no acute intracranial abnormality, diffuse cerebral atrophy.  Lab studies notable for lactic acidosis 3.9 which resolved after IV fluid.  Due to concern for possible sepsis, the patient received 1 dose of IV vancomycin , cefepime , and IV Flagyl  in the ER.  At the time of this visit, the patient is alert and oriented x 3.  He has no complaints.  States he was doing fine prior to this event and has been taking his medications, including his home insulin , as prescribed.  Denies having any cardiopulmonary, GU, or GI symptoms.  Due to his presenting severe hypoglycemia, TRH, hospitalist service, was asked to admit.  ED Course: Temperature 97.7.  BP 134/83, pulse 65, respiration rate 15, O2 saturation 98% on room air.  Review of Systems: Review of systems as noted in the  HPI. All other systems reviewed and are negative.   Past Medical History:  Diagnosis Date   Diabetes mellitus without complication (HCC)    Traumatic brain injury [facility])    Past Surgical History:  Procedure Laterality Date   APPENDECTOMY      Social History:  reports that he has been smoking. He uses smokeless tobacco. He reports that he does not drink alcohol. No history on file for drug use.   Allergies[1]  Family history: None reported.  Prior to Admission medications  Medication Sig Start Date End Date Taking? Authorizing Provider  apixaban  (ELIQUIS ) 5 MG TABS tablet Take 1 tablet (5 mg total) by mouth 2 (two) times daily. 05/04/22   Mallipeddi, Vishnu P, MD  diltiazem  (CARDIZEM  CD) 120 MG 24 hr capsule Take 1 capsule (120 mg total) by mouth daily. 05/04/22 05/04/23  Mallipeddi, Vishnu P, MD  doxycycline  (VIBRAMYCIN ) 100 MG capsule Take 1 capsule (100 mg total) by mouth 2 (two) times daily. One po bid x 7 days 04/06/23   Zammit, Joseph, MD  empagliflozin (JARDIANCE) 25 MG TABS tablet Take 25 mg by mouth daily. 02/09/22   [provider]  Glucose 15 GM/32ML GEL TAKE 1 TUBE BY MOUTH AS DIRECTED BY YOUR PROVIDER FOR LOW BLOOD SUGAR 10/07/21   [provider]  insulin  aspart (NOVOLOG ) 100 UNIT/ML injection Inject 6 Units into the skin 3 (three) times daily before meals. Pt takes on a sliding scale under 150 none over 151-200 6 units, 201-249 8 units, 250-300 10 units, 301 12 units    [provider]  insulin  glargine (LANTUS ) 100 UNIT/ML injection Inject 40 Units into the skin 2 (two) times daily.    [provider]  insulin  glargine-yfgn (SEMGLEE ) 100 UNIT/ML injection Inject 28 Units into the skin 2 (two) times daily. 04/14/22   [provider]  lisinopril (ZESTRIL) 20 MG tablet Take 20 mg by mouth daily. 02/09/22   [provider]  omeprazole  (PRILOSEC) 20 MG capsule Take 1 capsule (20 mg total) by mouth daily. 02/19/22   Midge Golas, MD  perphenazine (TRILAFON) 2 MG tablet Take 1 tablet by mouth 2 (two) times daily. 11/17/21   [provider]  rosuvastatin  (CRESTOR ) 20 MG tablet TAKE ONE-HALF TABLET BY MOUTH ONCE A DAY FOR CHOLESTEROL 05/02/22   [provider]  Semaglutide,0.25 or 0.5MG /DOS, 2 MG/3ML SOPN  02/03/22   [provider]    Physical Exam: BP 134/83   Pulse 65   Temp 97.7 F (36.5 C) (Oral)   Resp 15   Ht 5' 7 (1.702 m)   Wt 76.7 kg   SpO2 98%   BMI 26.47 kg/m   General: 59 y.o. year-old male well developed well nourished in no acute distress.  Alert and oriented x3. Cardiovascular: Regular rate and rhythm with no rubs or gallops.  No thyromegaly or JVD noted.  No lower extremity edema. 2/4 pulses in all 4 extremities. Respiratory: Clear to auscultation with no wheezes or rales. Good inspiratory effort. Abdomen: Soft nontender nondistended with normal bowel sounds x4 quadrants. Muskuloskeletal: No cyanosis, clubbing or edema noted bilaterally Neuro: CN II-XII intact, strength, sensation, reflexes Skin: No ulcerative lesions noted or rashes Psychiatry: Judgement and insight appear normal. Mood is appropriate for condition and setting          Labs on Admission:  Basic Metabolic Panel: Recent Labs  Lab 03/02/24 1506  NA 138  K 4.0  CL 102  CO2 19*  GLUCOSE 182*  BUN 15  CREATININE 1.40*  CALCIUM  9.4   Liver Function Tests: Recent Labs  Lab 03/02/24 1506  AST 58*  ALT 21  ALKPHOS 102  BILITOT <0.2  PROT 7.4  ALBUMIN 4.1   No results for input(s): LIPASE, AMYLASE in the last 168 hours. No results for input(s): AMMONIA in the last 168 hours. CBC: Recent Labs  Lab 03/02/24 1506  WBC 6.7  HGB 12.7*  HCT 39.6  MCV 87.0  PLT 332   Cardiac Enzymes: No results for input(s): CKTOTAL, CKMB, CKMBINDEX, TROPONINI in the last 168 hours.  BNP (last 3 results) No results for input(s): BNP in the last 8760 hours.  ProBNP (last 3  results) No results for input(s): PROBNP in the last 8760 hours.  CBG: Recent Labs  Lab 03/02/24 1441 03/02/24 1544 03/02/24 1843  GLUCAP 72 183* 95    Radiological Exams on Admission: CT HEAD WO CONTRAST ( ) Result Date: 03/02/2024 EXAM: CT HEAD WITHOUT CONTRAST 03/02/2024 06:11:40 PM TECHNIQUE: CT of the head was performed without the administration of intravenous contrast. Automated exposure control, iterative reconstruction, and/or weight based adjustment of the mA/kV was utilized to reduce the radiation dose to as low as reasonably achievable. COMPARISON: 04/06/2023. CLINICAL HISTORY: AMS. FINDINGS: BRAIN AND VENTRICLES: Diffuse cerebral atrophy. No acute hemorrhage. No evidence of acute infarct. No hydrocephalus. No extra-axial collection. No mass effect or midline shift. ORBITS: No acute abnormality. SINUSES: No acute abnormality. SOFT TISSUES AND SKULL: No acute soft tissue abnormality. No skull fracture. IMPRESSION: 1. No acute intracranial abnormality. 2. Diffuse cerebral atrophy. Electronically signed by:  Franky Crease MD 03/02/2024 06:27 PM EST RP Workstation: [id number]   DG Chest Portable 1 View Result Date: 03/02/2024 EXAM: 1 VIEW(S) XRAY OF THE CHEST 03/02/2024 03:36:30 PM COMPARISON: None available. CLINICAL HISTORY: hypothermia hypothermia hypothermia FINDINGS: LUNGS AND PLEURA: Low lung volumes. No focal pulmonary opacity. No pleural effusion. No pneumothorax. HEART AND MEDIASTINUM: No acute abnormality of the cardiac and mediastinal silhouettes. BONES AND SOFT TISSUES: No acute osseous abnormality. IMPRESSION: 1. Low lung volumes. Electronically signed by: Norleen Boxer MD 03/02/2024 04:19 PM EST RP Workstation: [id number]    EKG: I independently viewed the EKG done and my findings are as followed: Sinus bradycardia rate of 57.  QTc 469.  Assessment/Plan Present on Admission:  Hypoglycemia  Principal Problem:   Hypoglycemia  Severe hypoglycemia likely in the setting  of insulin  use without adequate oral intake Insulin -dependent type 2 diabetes CBG of 49 upon EMS assessment Last hemoglobin A1c 8.4 in 2024 Update hemoglobin A1c CBG every 2 hours, then every 4 hours. Hold off home insulin  for now. Encourage oral protein calorie intake  Acute metabolic encephalopathy in the setting of the above Mental status is back to baseline. Noncontrast head CT was nonacute.  Lactic acidosis secondary to combined effect of severe hypoglycemia and hypothermia Presented with lactic acid of 3.9, repeat 1.5 Severe hypoglycemia increases lactic production and hypothermia causes peripheral vasoconstriction leading to tissue hypoperfusion  High anion gap metabolic acidosis in the setting of a lactic acidosis Presented with serum bicarb of 19, anion gap of 17, and lactic acid of 3.9 Repeat chemistry panel in the morning.  Isolated elevated AST AST 58, ALT 21, 2:1 ratio No reported use of alcohol Repeat CMP in the morning Avoid hepatotoxic agents.  CKD 3A Appears to be at his baseline creatinine 1.40 with GFR 58 Avoid nephrotoxic agents, dehydration, and hypotension. Monitor urine output Repeat chemistry panel in the morning  Transient acute urinary retention Was able to urinate about 40 cc per nursing report Will give gentle IV fluid hydration Will obtain bladder scan and In-N-Out cath as needed.  Paroxysmal A-fib on Eliquis  Resume home Eliquis  Rate is currently controlled Monitor on telemetry.  Hypertension Resume home oral antihypertensive when blood pressure allows Closely monitor vital signs  Hyperlipidemia Resume home statin.  GERD Resume home PPI.  Bedbugs found on the patient, POA Contact precautions   Time: 75 minutes.   DVT prophylaxis: Home Eliquis  5 mg twice daily  Code Status: Full code.  Family Communication: None at bedside.  Disposition Plan: Admitted to telemetry unit.  Consults called: None.  Admission status:  Observation status.   Status is: Observation    Terry LOISE Hurst MD [facility] Pager [phone]  If 7PM-7AM, please contact night-coverage [email or web address] Password [email or web address]  03/02/2024, 7:35 PM      [1] No Known Allergies  "

## 2024-03-02 NOTE — ED Notes (Signed)
 Attempt at temp foley insertion. Pt in significant discomfort. Catheter pulled

## 2024-03-02 NOTE — ED Notes (Signed)
 Bear hugger d/c at this time

## 2024-03-02 NOTE — ED Provider Notes (Signed)
 " [place] EMERGENCY DEPARTMENT AT [facility] Provider Note   CSN: [id number] Arrival date & time: 03/02/24  1437     Patient presents with: Hypoglycemia   Keith Ewing is a 59 y.o. male with a history including hyperlipidemia, hypertension, diabetes with history of DKA, paroxysmal A-fib, TBI, GERD presenting for evaluation of altered mental status.  Patient is unable to give a clear history, he is awake but is unable to describe the events surrounding his symptoms today nor his transportation here.  Call placed to mother who is in her home states that he was in his normal state of health earlier this morning, but she called EMS when she discovered he was diaphoretic, intermittently losing consciousness and drooling.  A CBG was checked which was 49, EMS administered glucagon .  He remains awake at this time but is unable to give additional details about what happened, what medicines he has taken today nor whether he has had a meal today.  He has no complaints other than feeling cold at this time.  Of note upon arrival his temperature was 93.6.   The history is provided by a relative, the EMS personnel and the patient. The history is limited by the condition of the patient.       Prior to Admission medications  Medication Sig Start Date End Date Taking? Authorizing Provider  apixaban  (ELIQUIS ) 5 MG TABS tablet Take 1 tablet (5 mg total) by mouth 2 (two) times daily. 05/04/22   Mallipeddi, Vishnu P, MD  diltiazem  (CARDIZEM  CD) 120 MG 24 hr capsule Take 1 capsule (120 mg total) by mouth daily. 05/04/22 05/04/23  Mallipeddi, Vishnu P, MD  doxycycline  (VIBRAMYCIN ) 100 MG capsule Take 1 capsule (100 mg total) by mouth 2 (two) times daily. One po bid x 7 days 04/06/23   Zammit, Joseph, MD  empagliflozin (JARDIANCE) 25 MG TABS tablet Take 25 mg by mouth daily. 02/09/22   [provider]  Glucose 15 GM/32ML GEL TAKE 1 TUBE BY MOUTH AS DIRECTED BY YOUR PROVIDER FOR LOW BLOOD SUGAR  10/07/21   [provider]  insulin  aspart (NOVOLOG ) 100 UNIT/ML injection Inject 6 Units into the skin 3 (three) times daily before meals. Pt takes on a sliding scale under 150 none over 151-200 6 units, 201-249 8 units, 250-300 10 units, 301 12 units    [provider]  insulin  glargine (LANTUS ) 100 UNIT/ML injection Inject 40 Units into the skin 2 (two) times daily.    [provider]  insulin  glargine-yfgn (SEMGLEE ) 100 UNIT/ML injection Inject 28 Units into the skin 2 (two) times daily. 04/14/22   [provider]  lisinopril (ZESTRIL) 20 MG tablet Take 20 mg by mouth daily. 02/09/22   [provider]  omeprazole  (PRILOSEC) 20 MG capsule Take 1 capsule (20 mg total) by mouth daily. 02/19/22   Midge Golas, MD  perphenazine (TRILAFON) 2 MG tablet Take 1 tablet by mouth 2 (two) times daily. 11/17/21   [provider]  rosuvastatin  (CRESTOR ) 20 MG tablet TAKE ONE-HALF TABLET BY MOUTH ONCE A DAY FOR CHOLESTEROL 05/02/22   [provider]  Semaglutide,0.25 or 0.5MG /DOS, 2 MG/3ML SOPN  02/03/22   [provider]    Allergies: Patient has no known allergies.    Review of Systems  Unable to perform ROS: Mental status change  Neurological:  Positive for syncope.  Psychiatric/Behavioral:  Positive for confusion.     Updated Vital Signs BP 122/76   Pulse 68  Temp (!) 95.8 F (35.4 C) (Rectal)   Resp 11   Ht 5' 7 (1.702 m)   Wt 76.7 kg   SpO2 100%   BMI 26.47 kg/m   Physical Exam Vitals and nursing note reviewed.  Constitutional:      Appearance: He is well-developed.     Comments: Patient is awake but is slow to respond to questions.  HENT:     Head: Normocephalic and atraumatic.     Mouth/Throat:     Mouth: Mucous membranes are dry.  Eyes:     Conjunctiva/sclera: Conjunctivae normal.  Cardiovascular:     Rate and Rhythm: Normal rate and regular rhythm.     Heart sounds: Normal heart sounds.  Pulmonary:      Effort: Pulmonary effort is normal.     Breath sounds: Normal breath sounds. No wheezing.  Abdominal:     General: Bowel sounds are normal.     Palpations: Abdomen is soft.     Tenderness: There is no abdominal tenderness. There is no guarding.  Musculoskeletal:        General: Normal range of motion.     Cervical back: Normal range of motion.  Skin:    General: Skin is warm and dry.     Coloration: Skin is pale.     Comments: Through second cap refill in fingertips, pale.  Extremities are cool, radial and pedal pulses are intact.     (all labs ordered are listed, but only abnormal results are displayed) Labs Reviewed  COMPREHENSIVE METABOLIC PANEL WITH GFR - Abnormal; Notable for the following components:      Result Value   CO2 19 (*)    Glucose, Bld 182 (*)    Creatinine, Ser 1.40 (*)    AST 58 (*)    GFR, Estimated 58 (*)    Anion gap 17 (*)    All other components within normal limits  CBC - Abnormal; Notable for the following components:   Hemoglobin 12.7 (*)    All other components within normal limits  BLOOD GAS, ARTERIAL - Abnormal; Notable for the following components:   pO2, Arterial 78 (*)    All other components within normal limits  LACTIC ACID, PLASMA - Abnormal; Notable for the following components:   Lactic Acid, Venous 3.9 (*)    All other components within normal limits  CBG MONITORING, ED - Abnormal; Notable for the following components:   Glucose-Capillary 183 (*)    All other components within normal limits  CULTURE, BLOOD (ROUTINE X 2)  CULTURE, BLOOD (ROUTINE X 2)  LACTIC ACID, PLASMA  ETHANOL  PROTIME-INR  APTT  URINALYSIS, ROUTINE W REFLEX MICROSCOPIC  URINE DRUG SCREEN  CBG MONITORING, ED  CBG MONITORING, ED    EKG: EKG Interpretation Date/Time:  Sunday March 02 2024 14:59:41 EST Ventricular Rate:  57 PR Interval:  147 QRS Duration:  121 QT Interval:  481 QTC Calculation: 469 R Axis:   21  Text Interpretation: Sinus rhythm  Nonspecific intraventricular conduction delay Minimal ST elevation, anterior leads No significant change since prior 2/25 Confirmed by Towana Sharper [phone]) on 03/02/2024 3:38:32 PM  Radiology: CT HEAD WO CONTRAST ( ) Result Date: 03/02/2024 EXAM: CT HEAD WITHOUT CONTRAST 03/02/2024 06:11:40 PM TECHNIQUE: CT of the head was performed without the administration of intravenous contrast. Automated exposure control, iterative reconstruction, and/or weight based adjustment of the mA/kV was utilized to reduce the radiation dose to as low as reasonably achievable. COMPARISON: 04/06/2023. CLINICAL HISTORY: AMS. FINDINGS:  BRAIN AND VENTRICLES: Diffuse cerebral atrophy. No acute hemorrhage. No evidence of acute infarct. No hydrocephalus. No extra-axial collection. No mass effect or midline shift. ORBITS: No acute abnormality. SINUSES: No acute abnormality. SOFT TISSUES AND SKULL: No acute soft tissue abnormality. No skull fracture. IMPRESSION: 1. No acute intracranial abnormality. 2. Diffuse cerebral atrophy. Electronically signed by: Franky Crease MD 03/02/2024 06:27 PM EST RP Workstation: [id number]   DG Chest Portable 1 View Result Date: 03/02/2024 EXAM: 1 VIEW(S) XRAY OF THE CHEST 03/02/2024 03:36:30 PM COMPARISON: None available. CLINICAL HISTORY: hypothermia hypothermia hypothermia FINDINGS: LUNGS AND PLEURA: Low lung volumes. No focal pulmonary opacity. No pleural effusion. No pneumothorax. HEART AND MEDIASTINUM: No acute abnormality of the cardiac and mediastinal silhouettes. BONES AND SOFT TISSUES: No acute osseous abnormality. IMPRESSION: 1. Low lung volumes. Electronically signed by: Norleen Boxer MD 03/02/2024 04:19 PM EST RP Workstation: [id number]     Procedures   Medications Ordered in the ED  metroNIDAZOLE  (FLAGYL ) IVPB 500 mg (0 mg Intravenous Stopped 03/02/24 1842)  vancomycin  (VANCOCIN ) IVPB 1000 mg/200 mL premix (1,000 mg Intravenous New Bag/Given 03/02/24 1829)  lactated ringers  bolus 2,301  mL (2,301 mLs Intravenous New Bag/Given 03/02/24 1742)  ceFEPIme  (MAXIPIME ) 2 g in sodium chloride  0.9 % 100 mL IVPB (0 g Intravenous Stopped 03/02/24 1808)                                    Medical Decision Making Patient presenting with altered level of consciousness who continues to be although alert, still has no memory of what occurred today.  He believes he took his insulin  this morning but cannot recall eating any food today.  He presents with a significant hypothermia at 93.6.  Differential diagnosis includes confusion associated with hypoglycemia, although this confusion has not cleared now that his glucose level is stabilized.  He does have a significant bump in his lactate at 3.9, his second lactate is currently pending.  With the hypothermia, concern for sepsis of unclear origin.  He was given sepsis protocol IV fluids along with broad-spectrum antibiotics.  His last CBG was elevated at 183.  CT imaging brain has been ordered to rule out intracranial process, although patient moves all of its extremities without deficit, no facial droop, no dysarthria, doubt this represents CVA.    Amount and/or Complexity of Data Reviewed Labs: ordered.    Details: Labs reviewed including CBC, c-Met, he does have a low CO2 at 19 with an anion gap at 17, creatinine 1.4 which is stable for this patient, his glucose is 182 at this time.  Lactic acid elevated at 3.9 blood gas is stable, pH 7.44, ethanol negative, pending urinalysis and UDS. Radiology: ordered.    Details: Chest x-ray reviewed, no acute findings.  CT head pending to rule out CVA given patient is on Eliquis . CT negative Discussion of management or test interpretation with external provider(s): Call placed to hospitalist for admission.   Pt discussed with Katrinka Roosevelt PA who will arrange admission.  Risk Decision regarding hospitalization.   CRITICAL CARE Performed by: Mliss Narrow Total critical care time: 45 minutes Critical care  time was exclusive of separately billable procedures and treating other patients. Critical care was necessary to treat or prevent imminent or life-threatening deterioration. Critical care was time spent personally by me on the following activities: development of treatment plan with patient and/or surrogate as well as nursing, discussions with consultants, evaluation of  patient's response to treatment, examination of patient, obtaining history from patient or surrogate, ordering and performing treatments and interventions, ordering and review of laboratory studies, ordering and review of radiographic studies, pulse oximetry and re-evaluation of patient's condition.      Final diagnoses:  Hypoglycemia  Hypothermia, initial encounter  Disorientation    ED Discharge Orders     None          Birdena Mliss RIGGERS 03/02/24 1900  "

## 2024-03-03 LAB — COMPREHENSIVE METABOLIC PANEL WITH GFR
ALT: 26 U/L (ref 0–44)
AST: 60 U/L — ABNORMAL HIGH (ref 15–41)
Albumin: 3.8 g/dL (ref 3.5–5.0)
Alkaline Phosphatase: 91 U/L (ref 38–126)
Anion gap: 11 (ref 5–15)
BUN: 13 mg/dL (ref 6–20)
CO2: 26 mmol/L (ref 22–32)
Calcium: 8.7 mg/dL — ABNORMAL LOW (ref 8.9–10.3)
Chloride: 98 mmol/L (ref 98–111)
Creatinine, Ser: 1.36 mg/dL — ABNORMAL HIGH (ref 0.61–1.24)
GFR, Estimated: 60 mL/min — ABNORMAL LOW
Glucose, Bld: 274 mg/dL — ABNORMAL HIGH (ref 70–99)
Potassium: 4.4 mmol/L (ref 3.5–5.1)
Sodium: 135 mmol/L (ref 135–145)
Total Bilirubin: <0.2 mg/dL (ref 0.0–1.2)
Total Protein: 6.5 g/dL (ref 6.5–8.1)

## 2024-03-03 LAB — CBC
HCT: 36.9 % — ABNORMAL LOW (ref 39.0–52.0)
Hemoglobin: 11.8 g/dL — ABNORMAL LOW (ref 13.0–17.0)
MCH: 28.4 pg (ref 26.0–34.0)
MCHC: 32 g/dL (ref 30.0–36.0)
MCV: 88.9 fL (ref 80.0–100.0)
Platelets: 311 K/uL (ref 150–400)
RBC: 4.15 MIL/uL — ABNORMAL LOW (ref 4.22–5.81)
RDW: 12.6 % (ref 11.5–15.5)
WBC: 8.3 K/uL (ref 4.0–10.5)
nRBC: 0 % (ref 0.0–0.2)

## 2024-03-03 LAB — GLUCOSE, CAPILLARY
Glucose-Capillary: 145 mg/dL — ABNORMAL HIGH (ref 70–99)
Glucose-Capillary: 177 mg/dL — ABNORMAL HIGH (ref 70–99)
Glucose-Capillary: 195 mg/dL — ABNORMAL HIGH (ref 70–99)
Glucose-Capillary: 198 mg/dL — ABNORMAL HIGH (ref 70–99)
Glucose-Capillary: 21 mg/dL — CL (ref 70–99)
Glucose-Capillary: 238 mg/dL — ABNORMAL HIGH (ref 70–99)
Glucose-Capillary: 252 mg/dL — ABNORMAL HIGH (ref 70–99)
Glucose-Capillary: 282 mg/dL — ABNORMAL HIGH (ref 70–99)

## 2024-03-03 LAB — GLUCOSE, RANDOM: Glucose, Bld: 207 mg/dL — ABNORMAL HIGH (ref 70–99)

## 2024-03-03 LAB — MAGNESIUM: Magnesium: 1.7 mg/dL (ref 1.7–2.4)

## 2024-03-03 LAB — HEMOGLOBIN A1C
Hgb A1c MFr Bld: 11.8 % — ABNORMAL HIGH (ref 4.8–5.6)
Mean Plasma Glucose: 291.96 mg/dL

## 2024-03-03 LAB — HIV ANTIBODY (ROUTINE TESTING W REFLEX): HIV Screen 4th Generation wRfx: NONREACTIVE

## 2024-03-03 LAB — PHOSPHORUS: Phosphorus: 2.4 mg/dL — ABNORMAL LOW (ref 2.5–4.6)

## 2024-03-03 MED ORDER — INSULIN ASPART 100 UNIT/ML IJ SOLN
0.0000 [IU] | Freq: Three times a day (TID) | INTRAMUSCULAR | Status: DC
  Administered 2024-03-04: 1 [IU] via SUBCUTANEOUS
  Filled 2024-03-03 (×2): qty 1

## 2024-03-03 MED ORDER — GLUCAGON EMERGENCY 1 MG IJ SOLR: 1.0000 mg | Freq: Once | INTRAMUSCULAR | 1 refills | Status: AC | PRN

## 2024-03-03 MED ORDER — INSULIN GLARGINE 100 UNIT/ML ~~LOC~~ SOLN: 15.0000 [IU] | Freq: Every day | SUBCUTANEOUS | Status: AC

## 2024-03-03 MED ORDER — INSULIN ASPART 100 UNIT/ML IJ SOLN
0.0000 [IU] | Freq: Every day | INTRAMUSCULAR | Status: DC
  Administered 2024-03-03: 3 [IU] via SUBCUTANEOUS
  Filled 2024-03-03: qty 1

## 2024-03-03 MED ORDER — INSULIN GLARGINE 100 UNIT/ML ~~LOC~~ SOLN: 20.0000 [IU] | Freq: Every day | SUBCUTANEOUS | Status: DC

## 2024-03-03 MED ORDER — DEXTROSE 50 % IV SOLN
25.0000 g | INTRAVENOUS | Status: AC
  Administered 2024-03-03: 25 g via INTRAVENOUS
  Filled 2024-03-03: qty 50

## 2024-03-03 NOTE — Significant Event (Signed)
 Pt noted to be diaphoretic, responsive but delayed, increasingly confused, unable to identify self. CBG checked 24, then 18. pt consumed juice x2, recheck still low. Just gave amp D50, will recheck. Pt more alert at this time. Able to answer questions. Lynwood Glisson made aware, new orders noted.  James on unit for followup.

## 2024-03-03 NOTE — Hospital Course (Signed)
 59 y.o. male with medical history significant for insulin -dependent type 2 diabetes, paroxysmal A-fib on Eliquis , hypertension, hyperlipidemia, GERD, who presents to the ER due to altered mental status and severe hypoglycemia.  The patient lives at home with his mother.  He was found sitting up, diaphoretic, drooling, and going in and out of consciousness.  The patient was in his usual state of health yesterday.  EMS was activated.  Upon EMS arrival, the patient was minimally responsive.  Severely hypoglycemic with CBG of 49.  He was administered IM glucagon .  Brought into the ER for further evaluation.   In the ER, tremulous, repeat CBG of 72.  Bedbugs found on patient.  Hypothermic with temperature of 93.6 F, placed on Bair hugger with improvement to 97.7.  Patient's mental status improved and returned to baseline in the ER.  Noncontrast head CT revealed no acute intracranial abnormality, diffuse cerebral atrophy.  Lab studies notable for lactic acidosis 3.9 which resolved after IV fluid.   Due to concern for possible sepsis, the patient received 1 dose of IV vancomycin , cefepime , and IV Flagyl  in the ER.   At the time of this visit, the patient is alert and oriented x 3.  He has no complaints.  States he was doing fine prior to this event and has been taking his medications, including his home insulin , as prescribed.  Denies having any cardiopulmonary, GU, or GI symptoms.   Due to his presenting severe hypoglycemia, TRH, hospitalist service, was asked to admit.

## 2024-03-03 NOTE — Plan of Care (Signed)

## 2024-03-03 NOTE — Discharge Instructions (Signed)
IMPORTANT INFORMATION: PAY CLOSE ATTENTION   PHYSICIAN DISCHARGE INSTRUCTIONS  Follow with Primary care provider  [facility]  and other consultants as instructed by your Hospitalist Physician  SEEK MEDICAL CARE OR RETURN TO EMERGENCY ROOM IF SYMPTOMS COME BACK, WORSEN OR NEW PROBLEM DEVELOPS   Please note: You were cared for by a hospitalist during your hospital stay. Every effort will be made to forward records to your primary care provider.  You can request that your primary care provider send for your hospital records if they have not received them.  Once you are discharged, your primary care physician will handle any further medical issues. Please note that NO REFILLS for any discharge medications will be authorized once you are discharged, as it is imperative that you return to your primary care physician (or establish a relationship with a primary care physician if you do not have one) for your post hospital discharge needs so that they can reassess your need for medications and monitor your lab values.  Please get a complete blood count and chemistry panel checked by your Primary MD at your next visit, and again as instructed by your Primary MD.  Get Medicines reviewed and adjusted: Please take all your medications with you for your next visit with your Primary MD  Laboratory/radiological data: Please request your Primary MD to go over all hospital tests and procedure/radiological results at the follow up, please ask your primary care provider to get all Hospital records sent to his/her office.  In some cases, they will be blood work, cultures and biopsy results pending at the time of your discharge. Please request that your primary care provider follow up on these results.  If you are diabetic, please bring your blood sugar readings with you to your follow up appointment with primary care.    Please call and make your follow up appointments as soon as possible.    Also  Note the following: If you experience worsening of your admission symptoms, develop shortness of breath, life threatening emergency, suicidal or homicidal thoughts you must seek medical attention immediately by calling 911 or calling your MD immediately  if symptoms less severe.  You must read complete instructions/literature along with all the possible adverse reactions/side effects for all the Medicines you take and that have been prescribed to you. Take any new Medicines after you have completely understood and accpet all the possible adverse reactions/side effects.   Do not drive when taking Pain medications or sleeping medications (Benzodiazepines)  Do not take more than prescribed Pain, Sleep and Anxiety Medications. It is not advisable to combine anxiety,sleep and pain medications without talking with your primary care practitioner  Special Instructions: If you have smoked or chewed Tobacco  in the last 2 yrs please stop smoking, stop any regular Alcohol  and or any Recreational drug use.  Wear Seat belts while driving.  Do not drive if taking any narcotic, mind altering or controlled substances or recreational drugs or alcohol.       

## 2024-03-03 NOTE — Progress Notes (Signed)
 Pt admitted to unit, alert and oriented to self and place, slow responses at times. Denies pain. Oriented to safety precautions, unit and call bell system, instructed to call for assist when needed. Call light and belongings within reach, bed alarm activated, bed at lowest position.

## 2024-03-03 NOTE — Progress Notes (Signed)
 Both staff members and patient have been attempting to contact patient's family members for transport home. Unable to get any one to answer and multiple voice messages left. Unable to verify that anyone is at home to let pt in house, so we have not arranged alternate transportation. MD, Nursing supervisor, charge nurse and Case Manager all notified of same.

## 2024-03-03 NOTE — Plan of Care (Signed)

## 2024-03-03 NOTE — Discharge Summary (Addendum)
 Physician Discharge Summary  Keith Ewing FMW:[id number] DOB: [birth year] DOA: 03/02/2024  PCP: [facility]  Admit date: 03/02/2024 Discharge date: 03/03/2024  Admitted From:  Home  Disposition: Home  Recommendations for Outpatient Follow-up:  Follow up with PCP in 1 weeks Please monitor blood sugars at least 5x per day Insulin  doses reduced by more than 50% to try to prevent recurrent hypoglycemia  Home Health:  n/a   Discharge Condition: STABLE   CODE STATUS: FULL DIET: carb modified foods, avoid concentrated sweets or fruit juices except to treat a low blood glucose    Brief Hospitalization Summary: Please see all hospital notes, images, labs for full details of the hospitalization. Admission provider HPI:  59 y.o. male with medical history significant for insulin -dependent type 2 diabetes, paroxysmal A-fib on Eliquis , hypertension, hyperlipidemia, GERD, who presents to the ER due to altered mental status and severe hypoglycemia.  The patient lives at home with his mother.  He was found sitting up, diaphoretic, drooling, and going in and out of consciousness.  The patient was in his usual state of health yesterday.  EMS was activated.  Upon EMS arrival, the patient was minimally responsive.  Severely hypoglycemic with CBG of 49.  He was administered IM glucagon .  Brought into the ER for further evaluation.   In the ER, tremulous, repeat CBG of 72.  Bedbugs found on patient.  Hypothermic with temperature of 93.6 F, placed on Bair hugger with improvement to 97.7.  Patient's mental status improved and returned to baseline in the ER.  Noncontrast head CT revealed no acute intracranial abnormality, diffuse cerebral atrophy.  Lab studies notable for lactic acidosis 3.9 which resolved after IV fluid.   Due to concern for possible sepsis, the patient received 1 dose of IV vancomycin , cefepime , and IV Flagyl  in the ER.   At the time of this visit, the patient is alert and oriented x  3.  He has no complaints.  States he was doing fine prior to this event and has been taking his medications, including his home insulin , as prescribed.  Denies having any cardiopulmonary, GU, or GI symptoms.   Due to his presenting severe hypoglycemia, [facility], hospitalist service, was asked to admit.  Hospital Course  This patient was admitted into the hospital with an episode of severe hypoglycemia from insulin .  He is taking too much insulin  at baseline and not eating.  He reported that he had been taking 38 units of glargine twice daily.  He was also on a sliding scale fast-acting NovoLog  insulin .  We have reduced his insulin  doses by more than 50% and treated as hypoglycemia.  He was taking glargine twice daily which I have advised that he only take once daily.  We want to avoid recurrence of severe hypoglycemia.  He says that he follows up and receives diabetes care from the [facility].  I had advised him to follow-up as soon as possible in the next week for office visit.  He verbalized understanding.  Ambulatory referral to diabetes education and nutrition made.  He has had no further hypoglycemia and stable to discharge home.  He was given Rx for glucagon  emergency kit.  SEPSIS RULED OUT.    Discharge Diagnoses:  Principal Problem:   Hypoglycemia   Discharge Instructions:  Allergies as of 03/03/2024   No Known Allergies      Medication List     STOP taking these medications    insulin  glargine-yfgn 100 UNIT/ML injection Commonly known  as: SEMGLEE    Semaglutide(0.25 or 0.5MG /DOS) 2 MG/3ML Sopn       TAKE these medications    apixaban  5 MG Tabs tablet Commonly known as: Eliquis  Take 1 tablet (5 mg total) by mouth 2 (two) times daily.   diltiazem  120 MG 24 hr capsule Commonly known as: Cardizem  CD Take 1 capsule (120 mg total) by mouth daily.   empagliflozin 25 MG Tabs tablet Commonly known as: JARDIANCE Take 25 mg by mouth daily.   glucagon  Emergency 1 MG  Solr Inject 1 mg into the skin once as needed for up to 1 dose (severe hypoglycemia).   Glucose 15 GM/32ML Gel TAKE 1 TUBE BY MOUTH AS DIRECTED BY YOUR PROVIDER FOR LOW BLOOD SUGAR   insulin  aspart 100 UNIT/ML injection Commonly known as: novoLOG  Inject 6 Units into the skin 3 (three) times daily before meals. Pt takes on a sliding scale under 150 none over 151-200 6 units, 201-249 8 units, 250-300 10 units, 301 12 units   insulin  glargine 100 UNIT/ML injection Commonly known as: LANTUS  Inject 0.15 mLs (15 Units total) into the skin daily. Start taking on: March 04, 2024 What changed:  how much to take when to take this   lisinopril 20 MG tablet Commonly known as: ZESTRIL Take 20 mg by mouth daily.   omeprazole  20 MG capsule Commonly known as: PRILOSEC Take 1 capsule (20 mg total) by mouth daily.   perphenazine 2 MG tablet Commonly known as: TRILAFON Take 1 tablet by mouth 2 (two) times daily.   rosuvastatin  20 MG tablet Commonly known as: CRESTOR  TAKE ONE-HALF TABLET BY MOUTH ONCE A DAY FOR CHOLESTEROL        Follow-up Information     [facility] Schedule an appointment as soon as possible for a visit in 1 week(s).   Why: Hospital Follow Up Contact information: [address] [place] [address] [phone]                Allergies[1] Allergies as of 03/03/2024   No Known Allergies      Medication List     STOP taking these medications    insulin  glargine-yfgn 100 UNIT/ML injection Commonly known as: SEMGLEE    Semaglutide(0.25 or 0.5MG /DOS) 2 MG/3ML Sopn       TAKE these medications    apixaban  5 MG Tabs tablet Commonly known as: Eliquis  Take 1 tablet (5 mg total) by mouth 2 (two) times daily.   diltiazem  120 MG 24 hr capsule Commonly known as: Cardizem  CD Take 1 capsule (120 mg total) by mouth daily.   empagliflozin 25 MG Tabs tablet Commonly known as: JARDIANCE Take 25 mg by mouth daily.    glucagon  Emergency 1 MG Solr Inject 1 mg into the skin once as needed for up to 1 dose (severe hypoglycemia).   Glucose 15 GM/32ML Gel TAKE 1 TUBE BY MOUTH AS DIRECTED BY YOUR PROVIDER FOR LOW BLOOD SUGAR   insulin  aspart 100 UNIT/ML injection Commonly known as: novoLOG  Inject 6 Units into the skin 3 (three) times daily before meals. Pt takes on a sliding scale under 150 none over 151-200 6 units, 201-249 8 units, 250-300 10 units, 301 12 units   insulin  glargine 100 UNIT/ML injection Commonly known as: LANTUS  Inject 0.15 mLs (15 Units total) into the skin daily. Start taking on: March 04, 2024 What changed:  how much to take when to take this   lisinopril 20 MG tablet Commonly known as: ZESTRIL Take 20 mg  by mouth daily.   omeprazole  20 MG capsule Commonly known as: PRILOSEC Take 1 capsule (20 mg total) by mouth daily.   perphenazine 2 MG tablet Commonly known as: TRILAFON Take 1 tablet by mouth 2 (two) times daily.   rosuvastatin  20 MG tablet Commonly known as: CRESTOR  TAKE ONE-HALF TABLET BY MOUTH ONCE A DAY FOR CHOLESTEROL        Procedures/Studies: CT HEAD WO CONTRAST ( ) Result Date: 03/02/2024 EXAM: CT HEAD WITHOUT CONTRAST 03/02/2024 06:11:40 PM TECHNIQUE: CT of the head was performed without the administration of intravenous contrast. Automated exposure control, iterative reconstruction, and/or weight based adjustment of the mA/kV was utilized to reduce the radiation dose to as low as reasonably achievable. COMPARISON: 04/06/2023. CLINICAL HISTORY: AMS. FINDINGS: BRAIN AND VENTRICLES: Diffuse cerebral atrophy. No acute hemorrhage. No evidence of acute infarct. No hydrocephalus. No extra-axial collection. No mass effect or midline shift. ORBITS: No acute abnormality. SINUSES: No acute abnormality. SOFT TISSUES AND SKULL: No acute soft tissue abnormality. No skull fracture. IMPRESSION: 1. No acute intracranial abnormality. 2. Diffuse cerebral atrophy.  Electronically signed by: Franky Crease MD 03/02/2024 06:27 PM EST RP Workstation: [id number]   DG Chest Portable 1 View Result Date: 03/02/2024 EXAM: 1 VIEW(S) XRAY OF THE CHEST 03/02/2024 03:36:30 PM COMPARISON: None available. CLINICAL HISTORY: hypothermia hypothermia hypothermia FINDINGS: LUNGS AND PLEURA: Low lung volumes. No focal pulmonary opacity. No pleural effusion. No pneumothorax. HEART AND MEDIASTINUM: No acute abnormality of the cardiac and mediastinal silhouettes. BONES AND SOFT TISSUES: No acute osseous abnormality. IMPRESSION: 1. Low lung volumes. Electronically signed by: Norleen Boxer MD 03/02/2024 04:19 PM EST RP Workstation: [id number]     Subjective: Pt says he is feeling well now and back to baseline and having no more hypoglycemia.    Discharge Exam: Vitals:   03/02/24 2050 03/03/24 0120  BP: (!) 147/76 (!) 156/81  Pulse: 68 80  Resp: 18 18  Temp: 98.4 F (36.9 C) 97.6 F (36.4 C)  SpO2: 100% 98%   Vitals:   03/02/24 1900 03/02/24 2000 03/02/24 2050 03/03/24 0120  BP: 134/83 (!) 151/65 (!) 147/76 (!) 156/81  Pulse: 65 66 68 80  Resp: 15 18 18 18   Temp: 97.7 F (36.5 C) (!) 97.3 F (36.3 C) 98.4 F (36.9 C) 97.6 F (36.4 C)  TempSrc: Oral Rectal Oral Oral  SpO2: 98% 98% 100% 98%  Weight:      Height:       General: Pt is alert, awake, not in acute distress Cardiovascular: normal S1/S2 +, no rubs, no gallops Respiratory: CTA bilaterally, no wheezing, no rhonchi Abdominal: Soft, NT, ND, bowel sounds + Extremities: no edema, no cyanosis Neurological: nonfocal exam.    The results of significant diagnostics from this hospitalization (including imaging, microbiology, ancillary and laboratory) are listed below for reference.     Microbiology: Recent Results (from the past 240 hours)  Blood culture (routine x 2)     Status: None (Preliminary result)   Collection Time: 03/02/24  3:56 PM   Specimen: BLOOD  Result Value Ref Range Status   Specimen  Description BLOOD RIGHT ANTECUBITAL  Final   Special Requests   Final    BOTTLES DRAWN AEROBIC AND ANAEROBIC Blood Culture adequate volume   Culture   Final    NO GROWTH < 24 HOURS Performed at [facility], [address], [place], [place] [address]    Report Status PENDING  Incomplete  Blood culture (routine x 2)     Status: None (Preliminary  result)   Collection Time: 03/02/24  3:57 PM   Specimen: BLOOD  Result Value Ref Range Status   Specimen Description BLOOD LEFT ANTECUBITAL  Final   Special Requests   Final    BOTTLES DRAWN AEROBIC AND ANAEROBIC Blood Culture adequate volume   Culture   Final    NO GROWTH < 24 HOURS Performed at [facility], [address], [place], [place] [address]    Report Status PENDING  Incomplete     Labs: BNP (last 3 results) No results for input(s): BNP in the last 8760 hours. Basic Metabolic Panel: Recent Labs  Lab 03/02/24 1506 03/03/24 0146 03/03/24 0500  NA 138  --  135  K 4.0  --  4.4  CL 102  --  98  CO2 19*  --  26  GLUCOSE 182* 207* 274*  BUN 15  --  13  CREATININE 1.40*  --  1.36*  CALCIUM  9.4  --  8.7*  MG  --   --  1.7  PHOS  --   --  2.4*   Liver Function Tests: Recent Labs  Lab 03/02/24 1506 03/03/24 0500  AST 58* 60*  ALT 21 26  ALKPHOS 102 91  BILITOT <0.2 <0.2  PROT 7.4 6.5  ALBUMIN 4.1 3.8   No results for input(s): LIPASE, AMYLASE in the last 168 hours. No results for input(s): AMMONIA in the last 168 hours. CBC: Recent Labs  Lab 03/02/24 1506 03/03/24 0500  WBC 6.7 8.3  HGB 12.7* 11.8*  HCT 39.6 36.9*  MCV 87.0 88.9  PLT 332 311   Cardiac Enzymes: No results for input(s): CKTOTAL, CKMB, CKMBINDEX, TROPONINI in the last 168 hours. BNP: Invalid input(s): POCBNP CBG: Recent Labs  Lab 03/03/24 0136 03/03/24 0151 03/03/24 0530 03/03/24 0725 03/03/24 1107  GLUCAP 21* 145* 238* 195* 177*   D-Dimer No results for input(s): DDIMER in the last 72 hours. Hgb A1c Recent Labs     03/02/24 1506  HGBA1C 11.8*   Lipid Profile No results for input(s): CHOL, HDL, LDLCALC, TRIG, CHOLHDL, LDLDIRECT in the last 72 hours. Thyroid function studies No results for input(s): TSH, T4TOTAL, T3FREE, THYROIDAB in the last 72 hours.  Invalid input(s): FREET3 Anemia work up No results for input(s): VITAMINB12, FOLATE, FERRITIN, TIBC, IRON, RETICCTPCT in the last 72 hours. Urinalysis    Component Value Date/Time   COLORURINE YELLOW 03/02/2024 1942   APPEARANCEUR HAZY (A) 03/02/2024 1942   LABSPEC 1.021 03/02/2024 1942   PHURINE 5.0 03/02/2024 1942   GLUCOSEU >=500 (A) 03/02/2024 1942   HGBUR MODERATE (A) 03/02/2024 1942   BILIRUBINUR NEGATIVE 03/02/2024 1942   KETONESUR NEGATIVE 03/02/2024 1942   PROTEINUR 30 (A) 03/02/2024 1942   UROBILINOGEN 0.2 10/01/2009 1723   NITRITE NEGATIVE 03/02/2024 1942   LEUKOCYTESUR NEGATIVE 03/02/2024 1942   Sepsis Labs Recent Labs  Lab 03/02/24 1506 03/03/24 0500  WBC 6.7 8.3   Microbiology Recent Results (from the past 240 hours)  Blood culture (routine x 2)     Status: None (Preliminary result)   Collection Time: 03/02/24  3:56 PM   Specimen: BLOOD  Result Value Ref Range Status   Specimen Description BLOOD RIGHT ANTECUBITAL  Final   Special Requests   Final    BOTTLES DRAWN AEROBIC AND ANAEROBIC Blood Culture adequate volume   Culture   Final    NO GROWTH < 24 HOURS Performed at [facility], [address], [place], [place] [address]    Report Status PENDING  Incomplete  Blood culture (routine x 2)     Status: None (Preliminary result)   Collection Time: 03/02/24  3:57 PM   Specimen: BLOOD  Result Value Ref Range Status   Specimen Description BLOOD LEFT ANTECUBITAL  Final   Special Requests   Final    BOTTLES DRAWN AEROBIC AND ANAEROBIC Blood Culture adequate volume   Culture   Final    NO GROWTH < 24 HOURS Performed at [facility], [address] [place], [place] [address]     Report Status PENDING  Incomplete    Time coordinating discharge: 36 mins  SIGNED:  Afton Louder, MD  [facility] 03/03/2024, 11:41 AM How to contact the [facility] Attending or Consulting provider 7A - 7P or covering provider during after hours 7P -7A, for this patient?  Check the care team in [facility] and look for a) attending/consulting [facility] provider listed and b) the TR[facility] team listed Log into [email or web address] and use [place]'s universal password to access. If you do not have the password, please contact the hospital operator. Locate the [facility] provider you are looking for under [facility] and page to a number that you can be directly reached. If you still have difficulty reaching the provider, please page the [facility] (Director on Call) for the Hospitalists listed on amion for assistance.     [1] No Known Allergies

## 2024-03-03 NOTE — Progress Notes (Signed)
" °   03/03/24 1140  TOC Brief Assessment  Insurance and Status Reviewed  Patient has primary care physician Yes  Home environment has been reviewed Home with family  Prior level of function: indendent  Prior/Current Home Services No current home services  Social Drivers of Health Review SDOH reviewed no interventions necessary  Readmission risk has been reviewed Yes  Transition of care needs no transition of care needs at this time   Inpatient Care Manager (ICM) has reviewed patient and no ICM needs have been identified at this time. We will continue to monitor patient advancement through interdisciplinary progression rounds. If new patient transition needs arise, please place a ICM consult.   "

## 2024-03-04 LAB — GLUCOSE, CAPILLARY
Glucose-Capillary: 145 mg/dL — ABNORMAL HIGH (ref 70–99)
Glucose-Capillary: 128 mg/dL — ABNORMAL HIGH (ref 70–99)
Glucose-Capillary: 219 mg/dL — ABNORMAL HIGH (ref 70–99)

## 2024-03-04 MED ORDER — INSULIN ASPART 100 UNIT/ML IJ SOLN
3.0000 [IU] | Freq: Three times a day (TID) | INTRAMUSCULAR | Status: DC
  Administered 2024-03-04: 3 [IU] via SUBCUTANEOUS
  Filled 2024-03-04 (×2): qty 1

## 2024-03-04 MED ORDER — INSULIN GLARGINE 100 UNIT/ML ~~LOC~~ SOLN
10.0000 [IU] | Freq: Every day | SUBCUTANEOUS | Status: DC
  Administered 2024-03-04: 10 [IU] via SUBCUTANEOUS
  Filled 2024-03-04 (×2): qty 0.1

## 2024-03-04 NOTE — Plan of Care (Signed)
  Problem: Clinical Measurements: Goal: Ability to maintain clinical measurements within normal limits will improve Outcome: Progressing   Problem: Activity: Goal: Risk for activity intolerance will decrease Outcome: Progressing   Problem: Nutrition: Goal: Adequate nutrition will be maintained Outcome: Progressing   Problem: Safety: Goal: Ability to remain free from injury will improve Outcome: Progressing   

## 2024-03-04 NOTE — Plan of Care (Signed)
 " Problem: Education: Goal: Knowledge of General Education information will improve Description: Including pain rating scale, medication(s)/side effects and non-pharmacologic comfort measures 03/04/2024 1241 by Mathews Norleen POUR, RN Outcome: Adequate for Discharge 03/04/2024 0949 by Mathews Norleen POUR, RN Outcome: Progressing   Problem: Health Behavior/Discharge Planning: Goal: Ability to manage health-related needs will improve 03/04/2024 1241 by Mathews Norleen POUR, RN Outcome: Adequate for Discharge 03/04/2024 0949 by Mathews Norleen POUR, RN Outcome: Progressing   Problem: Clinical Measurements: Goal: Ability to maintain clinical measurements within normal limits will improve 03/04/2024 1241 by Mathews Norleen POUR, RN Outcome: Adequate for Discharge 03/04/2024 0949 by Mathews Norleen POUR, RN Outcome: Progressing Goal: Will remain free from infection 03/04/2024 1241 by Mathews Norleen POUR, RN Outcome: Adequate for Discharge 03/04/2024 0949 by Mathews Norleen POUR, RN Outcome: Progressing Goal: Diagnostic test results will improve 03/04/2024 1241 by Mathews Norleen POUR, RN Outcome: Adequate for Discharge 03/04/2024 0949 by Mathews Norleen POUR, RN Outcome: Progressing Goal: Respiratory complications will improve 03/04/2024 1241 by Mathews Norleen POUR, RN Outcome: Adequate for Discharge 03/04/2024 0949 by Mathews Norleen POUR, RN Outcome: Progressing Goal: Cardiovascular complication will be avoided 03/04/2024 1241 by Mathews Norleen POUR, RN Outcome: Adequate for Discharge 03/04/2024 0949 by Mathews Norleen POUR, RN Outcome: Progressing   Problem: Activity: Goal: Risk for activity intolerance will decrease 03/04/2024 1241 by Mathews Norleen POUR, RN Outcome: Adequate for Discharge 03/04/2024 [phone] by Mathews Norleen POUR, RN Outcome: Progressing   Problem: Nutrition: Goal: Adequate nutrition will be maintained 03/04/2024 1241 by Mathews Norleen POUR, RN Outcome: Adequate for Discharge 03/04/2024 0949 by Mathews Norleen POUR, RN Outcome: Progressing   Problem: Coping: Goal: Level of anxiety  will decrease 03/04/2024 1241 by Mathews Norleen POUR, RN Outcome: Adequate for Discharge 03/04/2024 0949 by Mathews Norleen POUR, RN Outcome: Progressing   Problem: Elimination: Goal: Will not experience complications related to bowel motility 03/04/2024 1241 by Mathews Norleen POUR, RN Outcome: Adequate for Discharge 03/04/2024 0949 by Mathews Norleen POUR, RN Outcome: Progressing Goal: Will not experience complications related to urinary retention 03/04/2024 1241 by Mathews Norleen POUR, RN Outcome: Adequate for Discharge 03/04/2024 0949 by Mathews Norleen POUR, RN Outcome: Progressing   Problem: Pain Managment: Goal: General experience of comfort will improve and/or be controlled 03/04/2024 1241 by Mathews Norleen POUR, RN Outcome: Adequate for Discharge 03/04/2024 0949 by Mathews Norleen POUR, RN Outcome: Progressing   Problem: Safety: Goal: Ability to remain free from injury will improve 03/04/2024 1241 by Mathews Norleen POUR, RN Outcome: Adequate for Discharge 03/04/2024 0949 by Mathews Norleen POUR, RN Outcome: Progressing   Problem: Skin Integrity: Goal: Risk for impaired skin integrity will decrease 03/04/2024 1241 by Mathews Norleen POUR, RN Outcome: Adequate for Discharge 03/04/2024 0949 by Mathews Norleen POUR, RN Outcome: Progressing   Problem: Education: Goal: Ability to describe self-care measures that may prevent or decrease complications (Diabetes Survival Skills Education) will improve 03/04/2024 1241 by Mathews Norleen POUR, RN Outcome: Adequate for Discharge 03/04/2024 [phone] by Mathews Norleen POUR, RN Outcome: Progressing Goal: Individualized Educational Video(s) 03/04/2024 1241 by Mathews Norleen POUR, RN Outcome: Adequate for Discharge 03/04/2024 [phone] by Mathews Norleen POUR, RN Outcome: Progressing   Problem: Coping: Goal: Ability to adjust to condition or change in health will improve 03/04/2024 1241 by Mathews Norleen POUR, RN Outcome: Adequate for Discharge 03/04/2024 0949 by Mathews Norleen POUR, RN Outcome: Progressing   Problem: Fluid Volume: Goal: Ability to maintain  a balanced intake and output will improve 03/04/2024 1241 by Mathews Norleen POUR, RN Outcome: Adequate for Discharge  03/04/2024 0949 by Mathews Norleen POUR, RN Outcome: Progressing   Problem: Health Behavior/Discharge Planning: Goal: Ability to identify and utilize available resources and services will improve 03/04/2024 1241 by Mathews Norleen POUR, RN Outcome: Adequate for Discharge 03/04/2024 0949 by Mathews Norleen POUR, RN Outcome: Progressing Goal: Ability to manage health-related needs will improve 03/04/2024 1241 by Mathews Norleen POUR, RN Outcome: Adequate for Discharge 03/04/2024 0949 by Mathews Norleen POUR, RN Outcome: Progressing   Problem: Metabolic: Goal: Ability to maintain appropriate glucose levels will improve 03/04/2024 1241 by Mathews Norleen POUR, RN Outcome: Adequate for Discharge 03/04/2024 0949 by Mathews Norleen POUR, RN Outcome: Progressing   Problem: Nutritional: Goal: Maintenance of adequate nutrition will improve 03/04/2024 1241 by Mathews Norleen POUR, RN Outcome: Adequate for Discharge 03/04/2024 0949 by Mathews Norleen POUR, RN Outcome: Progressing Goal: Progress toward achieving an optimal weight will improve 03/04/2024 1241 by Mathews Norleen POUR, RN Outcome: Adequate for Discharge 03/04/2024 0949 by Mathews Norleen POUR, RN Outcome: Progressing   Problem: Skin Integrity: Goal: Risk for impaired skin integrity will decrease 03/04/2024 1241 by Mathews Norleen POUR, RN Outcome: Adequate for Discharge 03/04/2024 0949 by Mathews Norleen POUR, RN Outcome: Progressing   Problem: Tissue Perfusion: Goal: Adequacy of tissue perfusion will improve 03/04/2024 1241 by Mathews Norleen POUR, RN Outcome: Adequate for Discharge 03/04/2024 0949 by Mathews Norleen POUR, RN Outcome: Progressing   "

## 2024-03-04 NOTE — Progress Notes (Signed)
 Pt to be discharged home. Pt voices vehemently that he wishes to be discharged right now. Author made Pt aware of issues related to mother thinking that calls to her house were scams and when police arrived to her home, she seemed quite confused. Pt voices understanding that if he is alone, or taking primary care of his mother, and he has another hypoglycemic event that he could have very significant negative outcomes including death. TOC aware of issues with mother's mental status and stated if Pt is back to baseline and oriented x4, he is appropriate to discharge back to home. Discharge pending transport.

## 2024-03-04 NOTE — Plan of Care (Signed)

## 2024-03-07 LAB — CULTURE, BLOOD (ROUTINE X 2)
Culture: NO GROWTH
Culture: NO GROWTH
Special Requests: ADEQUATE
Special Requests: ADEQUATE
